# Patient Record
Sex: Female | Born: 1980 | Race: Black or African American | Hispanic: No | Smoking: Never smoker
Health system: Southern US, Community
[De-identification: ages and names within clinical notes are randomized; demographics above are authoritative.]

## PROBLEM LIST (undated history)

## (undated) DIAGNOSIS — IMO0002 Reserved for concepts with insufficient information to code with codable children: Secondary | ICD-10-CM

## (undated) DIAGNOSIS — J302 Other seasonal allergic rhinitis: Secondary | ICD-10-CM

## (undated) HISTORY — DX: Other seasonal allergic rhinitis: J30.2

## (undated) HISTORY — DX: Reserved for concepts with insufficient information to code with codable children: IMO0002

---

## 2000-12-12 ENCOUNTER — Other Ambulatory Visit: Admission: RE | Admit: 2000-12-12 | Discharge: 2000-12-12 | Payer: Self-pay | Admitting: Obstetrics and Gynecology

## 2001-01-08 ENCOUNTER — Encounter: Admission: RE | Admit: 2001-01-08 | Discharge: 2001-01-08 | Payer: Self-pay | Admitting: Obstetrics and Gynecology

## 2001-01-08 ENCOUNTER — Encounter: Payer: Self-pay | Admitting: Obstetrics and Gynecology

## 2001-01-23 ENCOUNTER — Emergency Department (HOSPITAL_COMMUNITY): Admission: EM | Admit: 2001-01-23 | Discharge: 2001-01-23 | Payer: Self-pay

## 2001-12-13 ENCOUNTER — Other Ambulatory Visit: Admission: RE | Admit: 2001-12-13 | Discharge: 2001-12-13 | Payer: Self-pay | Admitting: Obstetrics & Gynecology

## 2002-08-28 ENCOUNTER — Other Ambulatory Visit: Admission: RE | Admit: 2002-08-28 | Discharge: 2002-08-28 | Payer: Self-pay | Admitting: Obstetrics and Gynecology

## 2003-02-12 ENCOUNTER — Inpatient Hospital Stay (HOSPITAL_COMMUNITY): Admission: AD | Admit: 2003-02-12 | Discharge: 2003-02-14 | Payer: Self-pay | Admitting: Obstetrics & Gynecology

## 2003-03-27 ENCOUNTER — Other Ambulatory Visit: Admission: RE | Admit: 2003-03-27 | Discharge: 2003-03-27 | Payer: Self-pay | Admitting: Obstetrics and Gynecology

## 2004-04-29 ENCOUNTER — Other Ambulatory Visit: Admission: RE | Admit: 2004-04-29 | Discharge: 2004-04-29 | Payer: Self-pay | Admitting: Gynecology

## 2005-08-02 ENCOUNTER — Other Ambulatory Visit: Admission: RE | Admit: 2005-08-02 | Discharge: 2005-08-02 | Payer: Self-pay | Admitting: Gynecology

## 2006-09-19 ENCOUNTER — Other Ambulatory Visit: Admission: RE | Admit: 2006-09-19 | Discharge: 2006-09-19 | Payer: Self-pay | Admitting: Gynecology

## 2007-10-31 ENCOUNTER — Other Ambulatory Visit: Admission: RE | Admit: 2007-10-31 | Discharge: 2007-10-31 | Payer: Self-pay | Admitting: Gynecology

## 2008-11-18 ENCOUNTER — Ambulatory Visit: Payer: Self-pay | Admitting: Women's Health

## 2008-11-18 ENCOUNTER — Other Ambulatory Visit: Admission: RE | Admit: 2008-11-18 | Discharge: 2008-11-18 | Payer: Self-pay | Admitting: Gynecology

## 2008-11-18 ENCOUNTER — Encounter: Payer: Self-pay | Admitting: Women's Health

## 2009-11-29 ENCOUNTER — Ambulatory Visit: Payer: Self-pay | Admitting: Women's Health

## 2009-11-29 ENCOUNTER — Other Ambulatory Visit: Admission: RE | Admit: 2009-11-29 | Discharge: 2009-11-29 | Payer: Self-pay | Admitting: Gynecology

## 2010-04-06 ENCOUNTER — Ambulatory Visit: Payer: Self-pay | Admitting: Women's Health

## 2010-12-16 ENCOUNTER — Other Ambulatory Visit: Payer: Self-pay | Admitting: Women's Health

## 2010-12-16 ENCOUNTER — Other Ambulatory Visit (HOSPITAL_COMMUNITY)
Admission: RE | Admit: 2010-12-16 | Discharge: 2010-12-16 | Disposition: A | Discharge status: Home / Self Care | Payer: BC Managed Care – PPO | Source: Ambulatory Visit | Attending: Gynecology | Admitting: Gynecology

## 2010-12-16 ENCOUNTER — Encounter (INDEPENDENT_AMBULATORY_CARE_PROVIDER_SITE_OTHER): Payer: BC Managed Care – PPO | Admitting: Women's Health

## 2010-12-16 DIAGNOSIS — Z01419 Encounter for gynecological examination (general) (routine) without abnormal findings: Secondary | ICD-10-CM

## 2010-12-16 DIAGNOSIS — Z124 Encounter for screening for malignant neoplasm of cervix: Secondary | ICD-10-CM | POA: Insufficient documentation

## 2011-02-03 NOTE — H&P (Signed)
NAME:  CHYAN, CARNERO [facility]                   ACCOUNT NO.:  [id number]   MEDICAL RECORD NO.:  [id number]                   PATIENT TYPE:  INP   LOCATION:  9162                                 FACILITY:  [facility]   PHYSICIAN:  Genia Del, M.D.             DATE OF BIRTH:  January 06, 1981   DATE OF ADMISSION:  02/12/2003  DATE OF DISCHARGE:                                HISTORY & PHYSICAL   CHIEF COMPLAINT:  The patient is a 30 year old G2 P0 A1 with expected date  of delivery by ultrasound Feb 10, 2003 at 40 weeks and two days gestation.   REASON FOR ADMISSION:  Regular uterine contractions since 1 a.m. on Feb 12, 2003.   HISTORY OF PRESENT ILLNESS:  The patient had increasing uterine contractions  becoming regular since 1 a.m.  No vaginal bleeding, no fluid leak, good  fetal movement, no PIH symptoms.   PAST MEDICAL HISTORY:  Positive for cystitis.   PAST SURGICAL HISTORY:  Positive for therapeutic abortion.   PAST OBSTETRICAL HISTORY:  August 2000, eight to ten weeks, therapeutic  abortion; no complication.   ALLERGIES:  No known drug allergies.   MEDICATIONS:  Prenate vitamins.   FAMILY HISTORY:  Positive for chronic hypertension and diabetes mellitus and  migraines.   HISTORY OF PRESENT PREGNANCY:  The first trimester was normal.  Labs showed  hemoglobin at 11.9, platelets 265, blood type and Rh B positive, Rh antibody  negative.  Electrophoresis of hemoglobin was within normal limits.  RPR  nonreactive, HbsAg negative, HIV nonreactive, rubella immune.  Pap test was  within normal limits, gonorrhea and chlamydia negative.  In the second  trimester an ultrasound was done for larger than gestational age per uterine  height and dating was adjusted by ultrasound at 17 weeks.  Triple test was  within normal limits.  Ultrasound review of anatomy was within normal  limits, placenta normal.  In the third trimester, one-hour glucose tolerance  test was normal.  Group B strep  was positive at 35 weeks.  Blood pressures  remained normal throughout pregnancy.  Uterine height responded well.   REVIEW OF SYSTEMS:  CONSTITUTIONAL:  Negative.  HEENT:  Negative.  CARDIOVASCULAR, RESPIRATORY:  Negative.  GI, URO:  Negative.  DERMATO,  NEURO, ENDOCRINO:  Negative.   PHYSICAL EXAMINATION:  GENERAL:  No apparent distress except for pain with  contractions.  VITAL SIGNS:  Blood pressure 131/81, pulse 81 to 121, respiratory rate 20,  temperature 98.9.  LUNGS:  Clear bilaterally.  HEART:  Regular cardiac rhythm.  ABDOMEN:  Gravid uterus, cephalic presentation.  PELVIC:  On admission, vaginal exam by nurse 6+ dilated, 100% effaced,  vertex, -1, membranes intact.  EXTREMITIES:  Lower limbs normal, no edema.  MONITORING:  Fetal heart rate 130s to 140s with good variability  accelerations positive, no decelerations.  Uterine contractions every two to  three minutes, 60 seconds, moderate.   IMPRESSION:  G2 P0 A1 at  40 weeks and two days gestation with spontaneous  labor, fetal well-being reassuring, group B strep positive.   PLAN:  1. Admit to labor and delivery.  2. Expectant management with probable vaginal delivery.  3. Penicillin G for group B strep positive per protocol.  4. Monitoring.                                               Genia Del, M.D.    ML/MEDQ  D:  02/12/2003  T:  02/12/2003  Job:  [id number]

## 2011-04-13 ENCOUNTER — Encounter: Payer: Self-pay | Admitting: Women's Health

## 2011-04-13 ENCOUNTER — Ambulatory Visit (INDEPENDENT_AMBULATORY_CARE_PROVIDER_SITE_OTHER): Payer: BC Managed Care – PPO | Admitting: Women's Health

## 2011-04-13 VITALS — BP 120/80

## 2011-04-13 DIAGNOSIS — B373 Candidiasis of vulva and vagina: Secondary | ICD-10-CM

## 2011-04-13 DIAGNOSIS — IMO0001 Reserved for inherently not codable concepts without codable children: Secondary | ICD-10-CM

## 2011-04-13 DIAGNOSIS — R350 Frequency of micturition: Secondary | ICD-10-CM

## 2011-04-13 MED ORDER — FLUCONAZOLE 150 MG PO TABS: 150.0000 mg | ORAL_TABLET | Freq: Once | ORAL | Status: AC

## 2011-04-13 NOTE — Progress Notes (Signed)
  Presents with a complaint of urinary frequency and some low-back pain. No CVAT. Denies a fever. Has had minimal discharge. Contraception on Ortho Evra patch.cycle is due this week UA is negative except for a small amount of blood will check a urine culture. External genitalia is within normal limits. Speculum exam scant brown discharge with no odor was noted. No erythema. Wet prep positive for yeast. We'll treat with Diflucan 150 by mouth x1 dose. Return to the office as needed or no relief.

## 2011-12-25 ENCOUNTER — Encounter: Payer: Self-pay | Admitting: Women's Health

## 2011-12-25 ENCOUNTER — Ambulatory Visit (INDEPENDENT_AMBULATORY_CARE_PROVIDER_SITE_OTHER): Payer: BC Managed Care – PPO | Admitting: Women's Health

## 2011-12-25 ENCOUNTER — Other Ambulatory Visit (HOSPITAL_COMMUNITY)
Admission: RE | Admit: 2011-12-25 | Discharge: 2011-12-25 | Disposition: A | Discharge status: Home / Self Care | Payer: BC Managed Care – PPO | Source: Ambulatory Visit | Attending: Obstetrics and Gynecology | Admitting: Obstetrics and Gynecology

## 2011-12-25 VITALS — BP 114/84 | Ht 65.25 in | Wt 188.0 lb

## 2011-12-25 DIAGNOSIS — IMO0001 Reserved for inherently not codable concepts without codable children: Secondary | ICD-10-CM

## 2011-12-25 DIAGNOSIS — Z309 Encounter for contraceptive management, unspecified: Secondary | ICD-10-CM

## 2011-12-25 DIAGNOSIS — E669 Obesity, unspecified: Secondary | ICD-10-CM | POA: Insufficient documentation

## 2011-12-25 DIAGNOSIS — Z01419 Encounter for gynecological examination (general) (routine) without abnormal findings: Secondary | ICD-10-CM | POA: Insufficient documentation

## 2011-12-25 LAB — CBC WITH DIFFERENTIAL/PLATELET
Basophils Relative: 0 % (ref 0–1)
Eosinophils Relative: 1 % (ref 0–5)
HCT: 41.8 % (ref 36.0–46.0)
Hemoglobin: 13.5 g/dL (ref 12.0–15.0)
MCH: 29.1 pg (ref 26.0–34.0)
MCHC: 32.3 g/dL (ref 30.0–36.0)
MCV: 90.1 fL (ref 78.0–100.0)
Monocytes Absolute: 0.3 10*3/uL (ref 0.1–1.0)
Monocytes Relative: 4 % (ref 3–12)
Neutro Abs: 3.7 10*3/uL (ref 1.7–7.7)

## 2011-12-25 MED ORDER — NORELGESTROMIN-ETH ESTRADIOL 150-35 MCG/24HR TD PTWK: 1.0000 | MEDICATED_PATCH | TRANSDERMAL | Status: DC

## 2011-12-25 NOTE — Patient Instructions (Signed)

## 2011-12-25 NOTE — Progress Notes (Signed)
Madison Combs 05-29-1981 [id number]    History:    The patient presents for annual exam.  Monthly 3-4 day cycle on Ortho Evra patch without complaint. History of normal Paps. Has lost 15 pounds in the past year with diet changes.   Past medical history, past surgical history, family history and social history were all reviewed and documented in the EPIC chart. Care was for her husband with neuroendocrine cancer tumors. High school teacher, plus teaches classes on line. Daughter Madison Combs age 31 doing well.   ROS:  A  ROS was performed and pertinent positives and negatives are included in the history.  Exam:  Filed Vitals:   12/25/11 0835  BP: 114/84    General appearance:  Normal Head/Neck:  Normal, without cervical or supraclavicular adenopathy. Thyroid:  Symmetrical, normal in size, without palpable masses or nodularity. Respiratory  Effort:  Normal  Auscultation:  Clear without wheezing or rhonchi Cardiovascular  Auscultation:  Regular rate, without rubs, murmurs or gallops  Edema/varicosities:  Not grossly evident Abdominal  Soft,nontender, without masses, guarding or rebound.  Liver/spleen:  No organomegaly noted  Hernia:  None appreciated  Skin  Inspection:  Grossly normal  Palpation:  Grossly normal Neurologic/psychiatric  Orientation:  Normal with appropriate conversation.  Mood/affect:  Normal  Genitourinary    Breasts: Examined lying and sitting.     Right: Without masses, retractions, discharge or axillary adenopathy.     Left: Without masses, retractions, discharge or axillary adenopathy.   Inguinal/mons:  Normal without inguinal adenopathy  External genitalia:  Normal  BUS/Urethra/Skene's glands:  Normal  Bladder:  Normal  Vagina:  Normal  Cervix:  Normal  Uterus:  normal in size, shape and contour.  Midline and mobile  Adnexa/parametria:     Rt: Without masses or tenderness.   Lt: Without masses or tenderness.  Anus and perineum: Normal  Digital  rectal exam: Normal sphincter tone without palpated masses or tenderness  Assessment/Plan:  31 y.o. MBF G1 P1 for annual exam without complaint.  Normal GYN exam on Ortho Evra patch Obesity Situational stress /husband with cancer  Plan: Ortho Evra patch prescription, proper use, slight increased risk for blood clots and strokes. Reviewed slightly less effective when greater than 160 pounds. States would prefer to continue on the Ortho Evra patch as opposed to pills. Continue helalthier diet, increase regular exercise for continued weight loss, calcium rich foods and SBE's encouraged. Cares for husband with neuroendocrine cancer tumors, homebound, states coping well at this time. CBC, UA, Pap   [name] [facility], 9:15 AM 12/25/2011

## 2011-12-26 LAB — URINALYSIS W MICROSCOPIC + REFLEX CULTURE
Bilirubin Urine: NEGATIVE
Crystals: NONE SEEN
Glucose, UA: NEGATIVE mg/dL
Ketones, ur: NEGATIVE mg/dL
Protein, ur: NEGATIVE mg/dL

## 2011-12-28 ENCOUNTER — Other Ambulatory Visit: Payer: Self-pay | Admitting: *Deleted

## 2011-12-28 MED ORDER — CIPROFLOXACIN HCL 500 MG PO TABS: 500.0000 mg | ORAL_TABLET | Freq: Two times a day (BID) | ORAL | Status: AC

## 2011-12-30 LAB — URINE CULTURE

## 2012-01-03 ENCOUNTER — Other Ambulatory Visit: Payer: Self-pay | Admitting: Women's Health

## 2012-01-15 ENCOUNTER — Ambulatory Visit: Payer: BC Managed Care – PPO | Admitting: Women's Health

## 2012-01-22 ENCOUNTER — Ambulatory Visit: Payer: BC Managed Care – PPO | Admitting: Women's Health

## 2012-01-31 ENCOUNTER — Encounter: Payer: Self-pay | Admitting: Women's Health

## 2012-01-31 ENCOUNTER — Ambulatory Visit (INDEPENDENT_AMBULATORY_CARE_PROVIDER_SITE_OTHER): Payer: BC Managed Care – PPO | Admitting: Women's Health

## 2012-01-31 ENCOUNTER — Other Ambulatory Visit (HOSPITAL_COMMUNITY)
Admission: RE | Admit: 2012-01-31 | Discharge: 2012-01-31 | Disposition: A | Discharge status: Home / Self Care | Payer: BC Managed Care – PPO | Source: Ambulatory Visit | Attending: Obstetrics and Gynecology | Admitting: Obstetrics and Gynecology

## 2012-01-31 DIAGNOSIS — Z01419 Encounter for gynecological examination (general) (routine) without abnormal findings: Secondary | ICD-10-CM | POA: Insufficient documentation

## 2012-01-31 DIAGNOSIS — R87616 Satisfactory cervical smear but lacking transformation zone: Secondary | ICD-10-CM

## 2012-01-31 NOTE — Progress Notes (Signed)
Patient ID: Madison Combs, female   DOB: [birth year], 31 y.o.   MRN: [id number] Presents for repeat Pap. Pap at annual exam was normal but lacked endocervical cells. Pap taken will triage based on results.

## 2012-05-31 ENCOUNTER — Telehealth: Payer: Self-pay | Admitting: *Deleted

## 2012-05-31 NOTE — Telephone Encounter (Signed)
Telephone call, husband had been ill he had normal endocrine cancer with metastasis. 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 papers signed.

## 2012-05-31 NOTE — Telephone Encounter (Signed)
Pt has a question about FLMA paper she asked if you would sign? Pt said her husband recently passed away. Call back # [phone]. Pt doesn't have a PCP.

## 2012-06-03 ENCOUNTER — Telehealth: Payer: Self-pay | Admitting: Women's Health

## 2012-06-03 NOTE — Telephone Encounter (Signed)
Patient requested forms to be filled out for leave of absence for 6 weeks for work/ husband died 2012/06/05. Papers completed and condolences given. Patient is a high Engineer, site. Daughter [name] 8 doing well. Both have had counseling through hospice.

## 2012-06-26 ENCOUNTER — Telehealth: Payer: Self-pay | Admitting: *Deleted

## 2012-06-26 NOTE — Telephone Encounter (Signed)
Pt said she was told that you would write a return to work letter. Call back # [phone]

## 2012-06-26 NOTE — Telephone Encounter (Signed)
Telephone call, note to return to work faxed as requested.

## 2013-01-02 ENCOUNTER — Encounter: Payer: Self-pay | Admitting: Women's Health

## 2013-01-02 ENCOUNTER — Ambulatory Visit (INDEPENDENT_AMBULATORY_CARE_PROVIDER_SITE_OTHER): Payer: BC Managed Care – PPO | Admitting: Women's Health

## 2013-01-02 VITALS — BP 118/78 | Ht 65.0 in | Wt 187.0 lb

## 2013-01-02 DIAGNOSIS — Z01419 Encounter for gynecological examination (general) (routine) without abnormal findings: Secondary | ICD-10-CM

## 2013-01-02 DIAGNOSIS — IMO0001 Reserved for inherently not codable concepts without codable children: Secondary | ICD-10-CM

## 2013-01-02 DIAGNOSIS — Z309 Encounter for contraceptive management, unspecified: Secondary | ICD-10-CM

## 2013-01-02 DIAGNOSIS — Z13220 Encounter for screening for lipoid disorders: Secondary | ICD-10-CM

## 2013-01-02 DIAGNOSIS — F4321 Adjustment disorder with depressed mood: Secondary | ICD-10-CM | POA: Insufficient documentation

## 2013-01-02 LAB — CBC WITH DIFFERENTIAL/PLATELET
Basophils Absolute: 0 10*3/uL (ref 0.0–0.1)
Basophils Relative: 0 % (ref 0–1)
Eosinophils Relative: 1 % (ref 0–5)
HCT: 39.2 % (ref 36.0–46.0)
MCHC: 33.4 g/dL (ref 30.0–36.0)
MCV: 87.7 fL (ref 78.0–100.0)
Monocytes Absolute: 0.4 10*3/uL (ref 0.1–1.0)
RDW: 13.8 % (ref 11.5–15.5)

## 2013-01-02 LAB — LIPID PANEL: LDL Cholesterol: 127 mg/dL — ABNORMAL HIGH (ref 0–99)

## 2013-01-02 MED ORDER — NORELGESTROMIN-ETH ESTRADIOL 150-35 MCG/24HR TD PTWK: 1.0000 | MEDICATED_PATCH | TRANSDERMAL | Status: DC

## 2013-01-02 NOTE — Patient Instructions (Addendum)

## 2013-01-02 NOTE — Progress Notes (Signed)
Madison Combs [birth year] [id number]    History:    The patient presents for annual exam.  Monthly cycle on Ortho Evra patch. History of normal Paps. Not sexually active, husband died 05/02/12 from neuroendocrine cancer.  Past medical history, past surgical history, family history and social history were all reviewed and documented in the EPIC chart. 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 Mother hypertension.   ROS:  A  ROS was performed and pertinent positives and negatives are included in the history.  Exam:  Filed Vitals:   01/02/13 0944  BP: 118/78    General appearance:  Normal Head/Neck:  Normal, without cervical or supraclavicular adenopathy. Thyroid:  Symmetrical, normal in size, without palpable masses or nodularity. Respiratory  Effort:  Normal  Auscultation:  Clear without wheezing or rhonchi Cardiovascular  Auscultation:  Regular rate, without rubs, murmurs or gallops  Edema/varicosities:  Not grossly evident Abdominal  Soft,nontender, without masses, guarding or rebound.  Liver/spleen:  No organomegaly noted  Hernia:  None appreciated  Skin  Inspection:  Grossly normal  Palpation:  Grossly normal Neurologic/psychiatric  Orientation:  Normal with appropriate conversation.  Mood/affect:  Normal  Genitourinary    Breasts: Examined lying and sitting.     Right: Without masses, retractions, discharge or axillary adenopathy.     Left: Without masses, retractions, discharge or axillary adenopathy.   Inguinal/mons:  Normal without inguinal adenopathy  External genitalia:  Normal  BUS/Urethra/Skene's glands:  Normal  Bladder:  Normal  Vagina:  Normal  Cervix:  Normal  Uterus:   normal in size, shape and contour.  Midline and mobile  Adnexa/parametria:     Rt: Without masses or tenderness.   Lt: Without masses or tenderness.  Anus and perineum: Normal  Digital rectal exam: Normal sphincter tone without palpated masses or  tenderness  Assessment/Plan:  32 y.o. WBF G2 P1  for annual exam.    Normal GYN exam on Ortho Evra patch Occasional left breast tenderness with normal exam  Plan: Continue SBE's, decrease caffeinated beverages, instructed to call if changing breast exam. Continue regular exercise, currently doing [facility], calcium rich diet and vitamin D 1000 daily encouraged. Requests to stay on Ortho Evra patch for cycle control, reviewed slightly less effective due to wt., risk for blood clots and strokes reviewed. Ortho Evra patch prescription given, condoms encouraged if becomes sexually active. Denies need for further counseling has had grief counseling. CBC, lipid panel, UA, Pap normal 2013, new screening guidelines reviewed.  [name] [facility], 10:26 AM 01/02/2013

## 2013-01-03 LAB — URINALYSIS W MICROSCOPIC + REFLEX CULTURE
Bacteria, UA: NONE SEEN
Casts: NONE SEEN
Glucose, UA: NEGATIVE mg/dL
Hgb urine dipstick: NEGATIVE
Ketones, ur: NEGATIVE mg/dL
Protein, ur: NEGATIVE mg/dL
pH: 7.5 (ref 5.0–8.0)

## 2013-06-16 ENCOUNTER — Other Ambulatory Visit: Payer: Self-pay | Admitting: Women's Health

## 2013-11-08 ENCOUNTER — Other Ambulatory Visit: Payer: Self-pay | Admitting: Women's Health

## 2013-11-12 ENCOUNTER — Other Ambulatory Visit: Payer: Self-pay

## 2013-11-12 MED ORDER — NORELGESTROMIN-ETH ESTRADIOL 150-35 MCG/24HR TD PTWK: MEDICATED_PATCH | TRANSDERMAL | Status: DC

## 2014-01-07 ENCOUNTER — Encounter: Payer: Self-pay | Admitting: Women's Health

## 2014-01-07 ENCOUNTER — Ambulatory Visit (INDEPENDENT_AMBULATORY_CARE_PROVIDER_SITE_OTHER): Payer: BC Managed Care – PPO | Admitting: Women's Health

## 2014-01-07 VITALS — BP 124/80 | Ht 65.0 in | Wt 201.8 lb

## 2014-01-07 DIAGNOSIS — Z01419 Encounter for gynecological examination (general) (routine) without abnormal findings: Secondary | ICD-10-CM

## 2014-01-07 DIAGNOSIS — Z309 Encounter for contraceptive management, unspecified: Secondary | ICD-10-CM

## 2014-01-07 DIAGNOSIS — IMO0001 Reserved for inherently not codable concepts without codable children: Secondary | ICD-10-CM

## 2014-01-07 DIAGNOSIS — Z833 Family history of diabetes mellitus: Secondary | ICD-10-CM

## 2014-01-07 DIAGNOSIS — E079 Disorder of thyroid, unspecified: Secondary | ICD-10-CM

## 2014-01-07 LAB — CBC WITH DIFFERENTIAL/PLATELET
BASOS ABS: 0 10*3/uL (ref 0.0–0.1)
BASOS PCT: 0 % (ref 0–1)
EOS ABS: 0.1 10*3/uL (ref 0.0–0.7)
Eosinophils Relative: 1 % (ref 0–5)
HCT: 36.5 % (ref 36.0–46.0)
Hemoglobin: 12.7 g/dL (ref 12.0–15.0)
Lymphocytes Relative: 39 % (ref 12–46)
Lymphs Abs: 2.9 10*3/uL (ref 0.7–4.0)
MCH: 29.7 pg (ref 26.0–34.0)
MCHC: 34.8 g/dL (ref 30.0–36.0)
MCV: 85.3 fL (ref 78.0–100.0)
MONO ABS: 0.5 10*3/uL (ref 0.1–1.0)
Monocytes Relative: 7 % (ref 3–12)
NEUTROS ABS: 3.9 10*3/uL (ref 1.7–7.7)
NEUTROS PCT: 53 % (ref 43–77)
Platelets: 368 10*3/uL (ref 150–400)
RBC: 4.28 MIL/uL (ref 3.87–5.11)
RDW: 13.6 % (ref 11.5–15.5)
WBC: 7.4 10*3/uL (ref 4.0–10.5)

## 2014-01-07 LAB — TSH: TSH: 0.954 u[IU]/mL (ref 0.350–4.500)

## 2014-01-07 LAB — GLUCOSE, RANDOM: GLUCOSE: 82 mg/dL (ref 70–99)

## 2014-01-07 MED ORDER — NORELGESTROMIN-ETH ESTRADIOL 150-35 MCG/24HR TD PTWK: MEDICATED_PATCH | TRANSDERMAL | Status: DC

## 2014-01-07 NOTE — Progress Notes (Signed)
Sheralyn BoatmanDanielle M Bedel 1981-03-12 [id number]    History:    Presents for annual exam. 4 day monthly cycle on Ortho Evra patch. History of normal Paps. Not sexually active, husband died 04/2012 from neuroendocrine cancer. 12 pound weight gain in past year due to eating habits.  Past medical history, past surgical history, family history and social history were all reviewed and documented in the EPIC chart. 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 Mother hypertension.  ROS:  A  ROS was performed and pertinent positives and negatives are included.  Exam:  Filed Vitals:   01/07/14 0810  BP: 124/80    General appearance:  Normal, overweight Thyroid:  Symmetrical, normal in size, without palpable masses or nodularity. Respiratory  Auscultation:  Clear without wheezing or rhonchi Cardiovascular  Auscultation:  Regular rate, without rubs, murmurs or gallops  Edema/varicosities:  Not grossly evident Abdominal  Soft,nontender, without masses, guarding or rebound.  Liver/spleen:  No organomegaly noted  Hernia:  None appreciated  Skin  Inspection:  Grossly normal   Breasts: Examined lying and sitting.     Right: Without masses, retractions, discharge or axillary adenopathy.     Left: Without masses, retractions, discharge or axillary adenopathy. Gentitourinary   Inguinal/mons:  Normal without inguinal adenopathy  External genitalia:  Normal  BUS/Urethra/Skene's glands:  Normal  Vagina:  Normal  Cervix:  Normal  Uterus:  normal in size, shape and contour.  Midline and mobile  Adnexa/parametria:     Rt: Without masses or tenderness.   Lt: Without masses or tenderness.  Anus and perineum: Normal  Digital rectal exam: Normal sphincter tone without palpated masses or tenderness  Assessment/Plan:  33 y.o. WBF G2P1 for annual exam.   Normal GYN exam on Ortho Evra patch Obesity  Plan: Continue SBE's, continue regular exercise, decrease calorie  intake for weight loss, calcium rich diet and vitamin D 1000 daily encouraged. Ortho Evra patch for cycle control, risk for blood clots and strokes reviewed. Ortho Evra patch prescription given, condoms encouraged if becomes sexually active. Pap normal 2013, new guidelines reviewed. UA, TSH, Glu, CBC.   Harrington Challengerancy J Young [facility], 8:47 AM 01/07/2014

## 2014-01-07 NOTE — Patient Instructions (Signed)

## 2014-01-08 LAB — URINALYSIS W MICROSCOPIC + REFLEX CULTURE
BACTERIA UA: NONE SEEN
BILIRUBIN URINE: NEGATIVE
CASTS: NONE SEEN
CRYSTALS: NONE SEEN
GLUCOSE, UA: NEGATIVE mg/dL
Hgb urine dipstick: NEGATIVE
KETONES UR: NEGATIVE mg/dL
Leukocytes, UA: NEGATIVE
Nitrite: NEGATIVE
PH: 8 (ref 5.0–8.0)
Protein, ur: NEGATIVE mg/dL
SPECIFIC GRAVITY, URINE: 1.024 (ref 1.005–1.030)
Urobilinogen, UA: 0.2 mg/dL (ref 0.0–1.0)

## 2014-02-01 ENCOUNTER — Other Ambulatory Visit: Payer: Self-pay | Admitting: Women's Health

## 2014-03-19 ENCOUNTER — Encounter: Payer: Self-pay | Admitting: Women's Health

## 2014-03-19 ENCOUNTER — Ambulatory Visit (INDEPENDENT_AMBULATORY_CARE_PROVIDER_SITE_OTHER): Payer: BC Managed Care – PPO | Admitting: Women's Health

## 2014-03-19 DIAGNOSIS — N899 Noninflammatory disorder of vagina, unspecified: Secondary | ICD-10-CM

## 2014-03-19 DIAGNOSIS — N898 Other specified noninflammatory disorders of vagina: Secondary | ICD-10-CM

## 2014-03-19 DIAGNOSIS — R300 Dysuria: Secondary | ICD-10-CM

## 2014-03-19 LAB — URINALYSIS W MICROSCOPIC + REFLEX CULTURE
Casts: NONE SEEN
Crystals: NONE SEEN
Glucose, UA: NEGATIVE mg/dL
KETONES UR: NEGATIVE mg/dL
Leukocytes, UA: NEGATIVE
NITRITE: NEGATIVE
Specific Gravity, Urine: 1.025 (ref 1.005–1.030)
Urobilinogen, UA: 2 mg/dL — ABNORMAL HIGH (ref 0.0–1.0)
WBC, UA: NONE SEEN WBC/hpf (ref <3)
pH: 6 (ref 5.0–8.0)

## 2014-03-19 LAB — WET PREP FOR TRICH, YEAST, CLUE
Clue Cells Wet Prep HPF POC: NONE SEEN
Trich, Wet Prep: NONE SEEN
WBC WET PREP: NONE SEEN
Yeast Wet Prep HPF POC: NONE SEEN

## 2014-03-19 MED ORDER — NITROFURANTOIN MONOHYD MACRO 100 MG PO CAPS: 100.0000 mg | ORAL_CAPSULE | Freq: Two times a day (BID) | ORAL | Status: AC

## 2014-03-19 NOTE — Progress Notes (Signed)
Patient ID: Madison Combs, female   DOB: Oct 24, 1980, 33 y.o.   MRN: [id number]  Presents with urinary odor, frequency, urgency, and burning x 1 day. Denies fever, chills, back pain, hematuria, itching, or discharge. Not sexually active. Regular monthly cycles on Xulane patch.    Exam: appears well External genitalia: normal Speculum exam: normal. Wet prep: negative Bimanual: No CMT or adnexal tenderness or fullness UA: Mucus, small blood, trace protein, small bilirubin, 2.0 urobilinogen. Culture pending.   Clinical UTI  Plan: Macrobid 100mg  BID x 7 days. Urine culture pending. Instructed to increase fluids and empty bladder frequently. Will call if symptoms worsen or do not improve.

## 2014-03-19 NOTE — Patient Instructions (Signed)

## 2014-03-20 LAB — URINE CULTURE

## 2014-03-24 ENCOUNTER — Ambulatory Visit: Payer: BC Managed Care – PPO | Admitting: Women's Health

## 2014-07-20 ENCOUNTER — Encounter: Payer: Self-pay | Admitting: Women's Health

## 2014-09-01 ENCOUNTER — Telehealth: Payer: Self-pay | Admitting: *Deleted

## 2014-09-01 ENCOUNTER — Ambulatory Visit (INDEPENDENT_AMBULATORY_CARE_PROVIDER_SITE_OTHER): Payer: BC Managed Care – PPO | Admitting: Women's Health

## 2014-09-01 ENCOUNTER — Encounter: Payer: Self-pay | Admitting: Women's Health

## 2014-09-01 VITALS — BP 118/80 | Ht 65.0 in | Wt 195.0 lb

## 2014-09-01 DIAGNOSIS — N644 Mastodynia: Secondary | ICD-10-CM

## 2014-09-01 NOTE — Patient Instructions (Signed)

## 2014-09-01 NOTE — Telephone Encounter (Signed)
-----   Message from Harrington ChallengerNancy J Young, NP sent at 09/01/2014  8:53 AM EST ----- Victorino DikeJennifer, patient needs mammogram for bilateral outer aspect mastodynia for 2 weeks. Ultrasound/diagnostic if needed. Normal exam, no injury, breast pendulous. No family history of breast cancer. (Husband died of cancer 2 years ago) if appointment available this week and best, out-of-town week of December 21 following week any time okay.

## 2014-09-01 NOTE — Telephone Encounter (Signed)
Orders placed at breast center, they will contact pt to schedule. 

## 2014-09-01 NOTE — Progress Notes (Signed)
Patient ID: Madison BoatmanDanielle M Bruski, female   DOB: 05-May-1981, 33 y.o.   MRN: [id number] Presents with breast tenderness for 2 weeks, mostly outer aspects of both breasts for 2 weeks, cycle due next week. Contraceptives/cycle control on Ortho Evra patch. Not sexually active for greater than 2 years/widow. Husband died of cancer. Denies injury, activity or exercise change, increase or change in caffeinated beverages. Denies other problems at this time. No family history of breast cancer.  Exam: Appears well breast exam and sitting and lying position, pendulous, no visible retractions, dimpling, palpable nodules, thickening, ecchymosis or erythema. No nipple discharge. Tenderness  outer aspect of both breasts with exam.  Mastodynia  Plan: Diagnostic mammogram, vitamin E twice daily, avoid caffeinated beverages. Going on first date tonight since death of husband, reviewed anxiety may also play a role in tenderness. Has had grief counseling.

## 2014-09-02 NOTE — Telephone Encounter (Signed)
Appointment 09/15/14 @ 10:30 am

## 2014-09-15 ENCOUNTER — Ambulatory Visit
Admission: RE | Admit: 2014-09-15 | Discharge: 2014-09-15 | Disposition: A | Discharge status: Home / Self Care | Payer: BC Managed Care – PPO | Source: Ambulatory Visit | Attending: Women's Health | Admitting: Women's Health

## 2014-09-15 DIAGNOSIS — N644 Mastodynia: Secondary | ICD-10-CM

## 2015-01-13 ENCOUNTER — Ambulatory Visit (INDEPENDENT_AMBULATORY_CARE_PROVIDER_SITE_OTHER): Payer: BC Managed Care – PPO | Admitting: Women's Health

## 2015-01-13 ENCOUNTER — Other Ambulatory Visit (HOSPITAL_COMMUNITY)
Admission: RE | Admit: 2015-01-13 | Discharge: 2015-01-13 | Disposition: A | Discharge status: Home / Self Care | Payer: BC Managed Care – PPO | Source: Ambulatory Visit | Attending: Women's Health | Admitting: Women's Health

## 2015-01-13 ENCOUNTER — Encounter: Payer: Self-pay | Admitting: Women's Health

## 2015-01-13 VITALS — BP 110/70 | Ht 66.0 in | Wt 179.0 lb

## 2015-01-13 DIAGNOSIS — Z13220 Encounter for screening for lipoid disorders: Secondary | ICD-10-CM

## 2015-01-13 DIAGNOSIS — Z01411 Encounter for gynecological examination (general) (routine) with abnormal findings: Secondary | ICD-10-CM | POA: Insufficient documentation

## 2015-01-13 DIAGNOSIS — Z01419 Encounter for gynecological examination (general) (routine) without abnormal findings: Secondary | ICD-10-CM | POA: Diagnosis not present

## 2015-01-13 DIAGNOSIS — Z1151 Encounter for screening for human papillomavirus (HPV): Secondary | ICD-10-CM | POA: Insufficient documentation

## 2015-01-13 DIAGNOSIS — Z113 Encounter for screening for infections with a predominantly sexual mode of transmission: Secondary | ICD-10-CM

## 2015-01-13 DIAGNOSIS — Z3040 Encounter for surveillance of contraceptives, unspecified: Secondary | ICD-10-CM | POA: Diagnosis not present

## 2015-01-13 LAB — CBC WITH DIFFERENTIAL/PLATELET
BASOS ABS: 0 10*3/uL (ref 0.0–0.1)
Basophils Relative: 0 % (ref 0–1)
EOS PCT: 0 % (ref 0–5)
Eosinophils Absolute: 0 10*3/uL (ref 0.0–0.7)
HCT: 40.1 % (ref 36.0–46.0)
Hemoglobin: 13.3 g/dL (ref 12.0–15.0)
LYMPHS ABS: 2.7 10*3/uL (ref 0.7–4.0)
Lymphocytes Relative: 35 % (ref 12–46)
MCH: 29.8 pg (ref 26.0–34.0)
MCHC: 33.2 g/dL (ref 30.0–36.0)
MCV: 89.9 fL (ref 78.0–100.0)
MPV: 9.7 fL (ref 8.6–12.4)
Monocytes Absolute: 0.4 10*3/uL (ref 0.1–1.0)
Monocytes Relative: 5 % (ref 3–12)
NEUTROS ABS: 4.6 10*3/uL (ref 1.7–7.7)
Neutrophils Relative %: 60 % (ref 43–77)
Platelets: 379 10*3/uL (ref 150–400)
RBC: 4.46 MIL/uL (ref 3.87–5.11)
RDW: 14.4 % (ref 11.5–15.5)
WBC: 7.7 10*3/uL (ref 4.0–10.5)

## 2015-01-13 LAB — LIPID PANEL
CHOL/HDL RATIO: 3.4 ratio
Cholesterol: 249 mg/dL — ABNORMAL HIGH (ref 0–200)
HDL: 74 mg/dL (ref >=46)
LDL Cholesterol: 152 mg/dL — ABNORMAL HIGH (ref 0–99)
Triglycerides: 116 mg/dL (ref <150)
VLDL: 23 mg/dL (ref 0–40)

## 2015-01-13 LAB — GLUCOSE, RANDOM: Glucose, Bld: 78 mg/dL (ref 70–99)

## 2015-01-13 MED ORDER — NORELGESTROMIN-ETH ESTRADIOL 150-35 MCG/24HR TD PTWK: MEDICATED_PATCH | TRANSDERMAL | Status: DC

## 2015-01-13 NOTE — Progress Notes (Signed)
Madison BoatmanDanielle M Combs 1980/12/21 [id number]    History:    Presents for annual exam.  Regular monthly cycle on Ortho Evra patch. Normal Pap history. New partner. Husband died 04/2012 from neuroendocrine cancer. Has lost 22 pounds in the past year with diet and exercise.  Past medical history, past surgical history, family history and social history were all reviewed and documented in the EPIC chart. 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 Parents healthy.  ROS:  A ROS was performed and pertinent positives and negatives are included.  Exam:  Filed Vitals:   01/13/15 0836  BP: 110/70    General appearance:  Normal Thyroid:  Symmetrical, normal in size, without palpable masses or nodularity. Respiratory  Auscultation:  Clear without wheezing or rhonchi Cardiovascular  Auscultation:  Regular rate, without rubs, murmurs or gallops  Edema/varicosities:  Not grossly evident Abdominal  Soft,nontender, without masses, guarding or rebound.  Liver/spleen:  No organomegaly noted  Hernia:  None appreciated  Skin  Inspection:  Grossly normal   Breasts: Examined lying and sitting.     Right: Without masses, retractions, discharge or axillary adenopathy.     Left: Without masses, retractions, discharge or axillary adenopathy. Gentitourinary   Inguinal/mons:  Normal without inguinal adenopathy  External genitalia:  Normal  BUS/Urethra/Skene's glands:  Normal  Vagina:  Normal  Cervix:  Normal  Uterus:  normal in size, shape and contour.  Midline and mobile  Adnexa/parametria:     Rt: Without masses or tenderness.   Lt: Without masses or tenderness.  Anus and perineum: Normal  Digital rectal exam: Normal sphincter tone without palpated masses or tenderness  Assessment/Plan:  34 y.o.WBF G1P1  for annual exam with no complaints.  Monthly cycle on Ortho Evra patch STD screen  Plan: Ortho Evra patch prescription, proper use, slight risk for  blood clots and strokes reviewed. Condoms encouraged until permanent partner. SBE's, continue regular exercise and healthy eating for continued weight loss. CBC, glucose, lipid panel, UA, Pap with HR HPV typing, new screening guidelines reviewed. GC/Chlamydia, HIV, hep B, C, RPRHarrington Combs.  Madison Combs [facility]NP, 9:07 AM 01/13/2015

## 2015-01-13 NOTE — Patient Instructions (Signed)

## 2015-01-13 NOTE — Addendum Note (Signed)
Addended by: Aura CampsWEBB, JENNIFER L on: 01/13/2015 09:22 AM   Modules accepted: Orders

## 2015-01-14 LAB — URINALYSIS W MICROSCOPIC + REFLEX CULTURE
Casts: NONE SEEN
Crystals: NONE SEEN
Glucose, UA: NEGATIVE mg/dL
Nitrite: NEGATIVE
Protein, ur: NEGATIVE mg/dL
SPECIFIC GRAVITY, URINE: 1.027 (ref 1.005–1.030)
Urobilinogen, UA: 1 mg/dL (ref 0.0–1.0)
pH: 6 (ref 5.0–8.0)

## 2015-01-14 LAB — HEPATITIS C ANTIBODY: HCV AB: NEGATIVE

## 2015-01-14 LAB — GC/CHLAMYDIA PROBE AMP
CT Probe RNA: NEGATIVE
GC PROBE AMP APTIMA: NEGATIVE

## 2015-01-14 LAB — RPR

## 2015-01-14 LAB — HEPATITIS B SURFACE ANTIGEN: Hepatitis B Surface Ag: NEGATIVE

## 2015-01-14 LAB — CYTOLOGY - PAP

## 2015-01-14 LAB — HIV ANTIBODY (ROUTINE TESTING W REFLEX): HIV: NONREACTIVE

## 2015-01-16 LAB — URINE CULTURE

## 2015-01-26 ENCOUNTER — Other Ambulatory Visit: Payer: Self-pay

## 2015-01-26 DIAGNOSIS — Z3040 Encounter for surveillance of contraceptives, unspecified: Secondary | ICD-10-CM

## 2015-01-26 MED ORDER — NORELGESTROMIN-ETH ESTRADIOL 150-35 MCG/24HR TD PTWK: MEDICATED_PATCH | TRANSDERMAL | Status: DC

## 2015-02-08 ENCOUNTER — Other Ambulatory Visit: Payer: Self-pay | Admitting: Women's Health

## 2015-02-08 DIAGNOSIS — R8271 Bacteriuria: Secondary | ICD-10-CM

## 2015-02-09 ENCOUNTER — Other Ambulatory Visit: Payer: BC Managed Care – PPO

## 2015-02-09 DIAGNOSIS — R8271 Bacteriuria: Secondary | ICD-10-CM

## 2015-02-10 LAB — URINALYSIS W MICROSCOPIC + REFLEX CULTURE
BILIRUBIN URINE: NEGATIVE
Bacteria, UA: NONE SEEN
CRYSTALS: NONE SEEN
Casts: NONE SEEN
GLUCOSE, UA: NEGATIVE mg/dL
Hgb urine dipstick: NEGATIVE
Ketones, ur: NEGATIVE mg/dL
LEUKOCYTES UA: NEGATIVE
NITRITE: NEGATIVE
Protein, ur: NEGATIVE mg/dL
Specific Gravity, Urine: 1.023 (ref 1.005–1.030)
Squamous Epithelial / LPF: NONE SEEN
UROBILINOGEN UA: 1 mg/dL (ref 0.0–1.0)
pH: 5.5 (ref 5.0–8.0)

## 2015-06-15 ENCOUNTER — Other Ambulatory Visit: Payer: Self-pay

## 2015-06-15 DIAGNOSIS — Z3040 Encounter for surveillance of contraceptives, unspecified: Secondary | ICD-10-CM

## 2015-06-15 MED ORDER — NORELGESTROMIN-ETH ESTRADIOL 150-35 MCG/24HR TD PTWK: MEDICATED_PATCH | TRANSDERMAL | Status: DC

## 2015-07-30 ENCOUNTER — Ambulatory Visit (INDEPENDENT_AMBULATORY_CARE_PROVIDER_SITE_OTHER): Payer: BC Managed Care – PPO | Admitting: Women's Health

## 2015-07-30 ENCOUNTER — Encounter: Payer: Self-pay | Admitting: Women's Health

## 2015-07-30 VITALS — BP 120/84 | Ht 66.0 in | Wt 179.0 lb

## 2015-07-30 DIAGNOSIS — R350 Frequency of micturition: Secondary | ICD-10-CM

## 2015-07-30 DIAGNOSIS — N3001 Acute cystitis with hematuria: Secondary | ICD-10-CM | POA: Diagnosis not present

## 2015-07-30 DIAGNOSIS — N898 Other specified noninflammatory disorders of vagina: Secondary | ICD-10-CM | POA: Diagnosis not present

## 2015-07-30 LAB — URINALYSIS W MICROSCOPIC + REFLEX CULTURE
BILIRUBIN URINE: NEGATIVE
CASTS: NONE SEEN [LPF]
Crystals: NONE SEEN [HPF]
Glucose, UA: NEGATIVE
Ketones, ur: NEGATIVE
Leukocytes, UA: NEGATIVE
NITRITE: NEGATIVE
PH: 5 (ref 5.0–8.0)
Protein, ur: NEGATIVE
Specific Gravity, Urine: >1.03 (ref 1.001–1.035)
WBC, UA: NONE SEEN WBC/HPF (ref <=5)
Yeast: NONE SEEN [HPF]

## 2015-07-30 LAB — WET PREP FOR TRICH, YEAST, CLUE
CLUE CELLS WET PREP: NONE SEEN
Trich, Wet Prep: NONE SEEN
YEAST WET PREP: NONE SEEN

## 2015-07-30 MED ORDER — SULFAMETHOXAZOLE-TRIMETHOPRIM 800-160 MG PO TABS: 1.0000 | ORAL_TABLET | Freq: Two times a day (BID) | ORAL | Status: DC

## 2015-07-30 NOTE — Patient Instructions (Signed)

## 2015-07-30 NOTE — Progress Notes (Signed)
Patient ID: [name], female   DOB: 05-14-1981, 34 y.o.   MRN: [id number]  Presents with complaint of 2 day history of increased urinary frequency, urgency and slight pain at end of stream. New symptoms. Same partner/ monthly cycle on Ortho Evra patch. Minimal discharge , mild itching. Denies abdominal pain or fever.  Has lost 35 pounds in the past year with diet and exercise /boot camp.  Exam: Appears well. External genitalia within normal limits, speculum exam scant discharge no erythema or odor noted, wet prep negative. Bimanual no CMT or adnexal tenderness.  UA: +1 blood, no wbc's, 10-20 rbc's, few bacteria   Symptomatic UTI  with hematuria  Plan: Septra twice daily for 3 days #6 prescription, proper use given and reviewed. Urine culture pending. Instructed to call if symptoms persist.

## 2015-08-02 LAB — URINE CULTURE
Colony Count: NO GROWTH
Organism ID, Bacteria: NO GROWTH

## 2015-09-03 ENCOUNTER — Other Ambulatory Visit: Payer: Self-pay

## 2015-09-03 DIAGNOSIS — Z3040 Encounter for surveillance of contraceptives, unspecified: Secondary | ICD-10-CM

## 2015-09-03 MED ORDER — NORELGESTROMIN-ETH ESTRADIOL 150-35 MCG/24HR TD PTWK: MEDICATED_PATCH | TRANSDERMAL | Status: DC

## 2016-01-18 ENCOUNTER — Encounter: Payer: Self-pay | Admitting: Women's Health

## 2016-01-18 ENCOUNTER — Ambulatory Visit (INDEPENDENT_AMBULATORY_CARE_PROVIDER_SITE_OTHER): Payer: BC Managed Care – PPO | Admitting: Women's Health

## 2016-01-18 VITALS — BP 118/80 | Ht 66.0 in | Wt 166.0 lb

## 2016-01-18 DIAGNOSIS — Z833 Family history of diabetes mellitus: Secondary | ICD-10-CM

## 2016-01-18 DIAGNOSIS — Z113 Encounter for screening for infections with a predominantly sexual mode of transmission: Secondary | ICD-10-CM

## 2016-01-18 DIAGNOSIS — Z13220 Encounter for screening for lipoid disorders: Secondary | ICD-10-CM

## 2016-01-18 DIAGNOSIS — Z01419 Encounter for gynecological examination (general) (routine) without abnormal findings: Secondary | ICD-10-CM | POA: Diagnosis not present

## 2016-01-18 DIAGNOSIS — Z3040 Encounter for surveillance of contraceptives, unspecified: Secondary | ICD-10-CM

## 2016-01-18 LAB — CBC WITH DIFFERENTIAL/PLATELET
BASOS ABS: 0 {cells}/uL (ref 0–200)
BASOS PCT: 0 %
EOS ABS: 60 {cells}/uL (ref 15–500)
EOS PCT: 1 %
HCT: 40.7 % (ref 35.0–45.0)
Hemoglobin: 13.3 g/dL (ref 11.7–15.5)
LYMPHS PCT: 40 %
Lymphs Abs: 2400 cells/uL (ref 850–3900)
MCH: 30.2 pg (ref 27.0–33.0)
MCHC: 32.7 g/dL (ref 32.0–36.0)
MCV: 92.3 fL (ref 80.0–100.0)
MONOS PCT: 5 %
MPV: 9.5 fL (ref 7.5–12.5)
Monocytes Absolute: 300 cells/uL (ref 200–950)
NEUTROS PCT: 54 %
Neutro Abs: 3240 cells/uL (ref 1500–7800)
PLATELETS: 394 10*3/uL (ref 140–400)
RBC: 4.41 MIL/uL (ref 3.80–5.10)
RDW: 13.7 % (ref 11.0–15.0)
WBC: 6 10*3/uL (ref 3.8–10.8)

## 2016-01-18 LAB — GLUCOSE, RANDOM: Glucose, Bld: 79 mg/dL (ref 65–99)

## 2016-01-18 LAB — LIPID PANEL
CHOLESTEROL: 242 mg/dL — AB (ref 125–200)
HDL: 97 mg/dL (ref >=46)
LDL Cholesterol: 130 mg/dL — ABNORMAL HIGH (ref <130)
TRIGLYCERIDES: 75 mg/dL (ref <150)
Total CHOL/HDL Ratio: 2.5 Ratio (ref <=5.0)
VLDL: 15 mg/dL (ref <30)

## 2016-01-18 MED ORDER — NORELGESTROMIN-ETH ESTRADIOL 150-35 MCG/24HR TD PTWK: MEDICATED_PATCH | TRANSDERMAL | Status: DC

## 2016-01-18 NOTE — Progress Notes (Signed)
Madison Combs August 24, 1981 [id number]    History:    Presents for annual exam.  Monthly cycle on Ortho Evra, reports occasional spotting week between week 2 and 3. Normal Pap history. Sexually active with new partner/condoms. Has lost 15 pounds with diet and exercise in the past year.  Past medical history, past surgical history, family history and social history were all reviewed and documented in the EPIC chart. High school teacher. Husband died in 2013 from neuroendocrine cancer. Madison Combs is [age] doing well, has had gardasil.  ROS:  A ROS was performed and pertinent positives and negatives are included.  Exam:  Filed Vitals:   01/18/16 0804  BP: 118/80    General appearance:  Normal Thyroid:  Symmetrical, normal in size, without palpable masses or nodularity. Respiratory  Auscultation:  Clear without wheezing or rhonchi Cardiovascular  Auscultation:  Regular rate, without rubs, murmurs or gallops  Edema/varicosities:  Not grossly evident Abdominal  Soft,nontender, without masses, guarding or rebound.  Liver/spleen:  No organomegaly noted  Hernia:  None appreciated  Skin  Inspection:  Grossly normal   Breasts: Examined lying and sitting.     Right: Without masses, retractions, discharge or axillary adenopathy.     Left: Without masses, retractions, discharge or axillary adenopathy. Gentitourinary   Inguinal/mons:  Normal without inguinal adenopathy  External genitalia:  Normal  BUS/Urethra/Skene's glands:  Normal  Vagina:  Normal  Cervix:  Normal  Uterus:   normal in size, shape and contour.  Midline and mobile  Adnexa/parametria:     Rt: Without masses or tenderness.   Lt: Without masses or tenderness.  Anus and perineum: Normal  Digital rectal exam: Normal sphincter tone without palpated masses or tenderness  Assessment/Plan:  35 y.o. WBF G1 P1 for annual exam with occasional urinary frequency.  Monthly cycle on Ortho Evra STD screen  Plan: Contraception  options reviewed would like to continue Ortho Evra prescription, proper use given and reviewed slight risk for blood clots and strokes. Condoms encouraged if sexually active. Instructed to keep menstrual record if spotting persists instructed to call. SBE's, continue healthy exercise routine, calcium rich foods, vitamin D 1000 daily encouraged. CBC, glucose, lipid panel, UA, GC/Chlamydia, HIV, hep B, C, RPR. Pap normal 2016, new screening guidelines reviewed.    Harrington ChallengerYOUNG,[name] WHNP, 8:25 AM 01/18/2016

## 2016-01-18 NOTE — Patient Instructions (Signed)
Health Maintenance, Female Adopting a healthy lifestyle and getting preventive care can go a long way to promote health and wellness. Talk with your health care provider about what schedule of regular examinations is right for you. This is a good chance for you to check in with your provider about disease prevention and staying healthy. In between checkups, there are plenty of things you can do on your own. Experts have done a lot of research about which lifestyle changes and preventive measures are most likely to keep you healthy. Ask your health care provider for more information. WEIGHT AND DIET  Eat a healthy diet  Be sure to include plenty of vegetables, fruits, low-fat dairy products, and lean protein.  Do not eat a lot of foods high in solid fats, added sugars, or salt.  Get regular exercise. This is one of the most important things you can do for your health.  Most adults should exercise for at least 150 minutes each week. The exercise should increase your heart rate and make you sweat (moderate-intensity exercise).  Most adults should also do strengthening exercises at least twice a week. This is in addition to the moderate-intensity exercise.  Maintain a healthy weight  Body mass index (BMI) is a measurement that can be used to identify possible weight problems. It estimates body fat based on height and weight. Your health care provider can help determine your BMI and help you achieve or maintain a healthy weight.  For females 20 years of age and older:   A BMI below 18.5 is considered underweight.  A BMI of 18.5 to 24.9 is normal.  A BMI of 25 to 29.9 is considered overweight.  A BMI of 30 and above is considered obese.  Watch levels of cholesterol and blood lipids  You should start having your blood tested for lipids and cholesterol at 35 years of age, then have this test every 5 years.  You may need to have your cholesterol levels checked more often if:  Your lipid  or cholesterol levels are high.  You are older than 35 years of age.  You are at high risk for heart disease.  CANCER SCREENING   Lung Cancer  Lung cancer screening is recommended for adults 55-80 years old who are at high risk for lung cancer because of a history of smoking.  A yearly low-dose CT scan of the lungs is recommended for people who:  Currently smoke.  Have quit within the past 15 years.  Have at least a 30-pack-year history of smoking. A pack year is smoking an average of one pack of cigarettes a day for 1 year.  Yearly screening should continue until it has been 15 years since you quit.  Yearly screening should stop if you develop a health problem that would prevent you from having lung cancer treatment.  Breast Cancer  Practice breast self-awareness. This means understanding how your breasts normally appear and feel.  It also means doing regular breast self-exams. Let your health care provider know about any changes, no matter how small.  If you are in your 20s or 30s, you should have a clinical breast exam (CBE) by a health care provider every 1-3 years as part of a regular health exam.  If you are 40 or older, have a CBE every year. Also consider having a breast X-ray (mammogram) every year.  If you have a family history of breast cancer, talk to your health care provider about genetic screening.  If you   are at high risk for breast cancer, talk to your health care provider about having an MRI and a mammogram every year.  Breast cancer gene (BRCA) assessment is recommended for women who have family members with BRCA-related cancers. BRCA-related cancers include:  Breast.  Ovarian.  Tubal.  Peritoneal cancers.  Results of the assessment will determine the need for genetic counseling and BRCA1 and BRCA2 testing. Cervical Cancer Your health care provider may recommend that you be screened regularly for cancer of the pelvic organs (ovaries, uterus, and  vagina). This screening involves a pelvic examination, including checking for microscopic changes to the surface of your cervix (Pap test). You may be encouraged to have this screening done every 3 years, beginning at age 21.  For women ages 30-65, health care providers may recommend pelvic exams and Pap testing every 3 years, or they may recommend the Pap and pelvic exam, combined with testing for human papilloma virus (HPV), every 5 years. Some types of HPV increase your risk of cervical cancer. Testing for HPV may also be done on women of any age with unclear Pap test results.  Other health care providers may not recommend any screening for nonpregnant women who are considered low risk for pelvic cancer and who do not have symptoms. Ask your health care provider if a screening pelvic exam is right for you.  If you have had past treatment for cervical cancer or a condition that could lead to cancer, you need Pap tests and screening for cancer for at least 20 years after your treatment. If Pap tests have been discontinued, your risk factors (such as having a new sexual partner) need to be reassessed to determine if screening should resume. Some women have medical problems that increase the chance of getting cervical cancer. In these cases, your health care provider may recommend more frequent screening and Pap tests. Colorectal Cancer  This type of cancer can be detected and often prevented.  Routine colorectal cancer screening usually begins at 35 years of age and continues through 35 years of age.  Your health care provider may recommend screening at an earlier age if you have risk factors for colon cancer.  Your health care provider may also recommend using home test kits to check for hidden blood in the stool.  A small camera at the end of a tube can be used to examine your colon directly (sigmoidoscopy or colonoscopy). This is done to check for the earliest forms of colorectal  cancer.  Routine screening usually begins at age 50.  Direct examination of the colon should be repeated every 5-10 years through 35 years of age. However, you may need to be screened more often if early forms of precancerous polyps or small growths are found. Skin Cancer  Check your skin from head to toe regularly.  Tell your health care provider about any new moles or changes in moles, especially if there is a change in a mole's shape or color.  Also tell your health care provider if you have a mole that is larger than the size of a pencil eraser.  Always use sunscreen. Apply sunscreen liberally and repeatedly throughout the day.  Protect yourself by wearing long sleeves, pants, a wide-brimmed hat, and sunglasses whenever you are outside. HEART DISEASE, DIABETES, AND HIGH BLOOD PRESSURE   High blood pressure causes heart disease and increases the risk of stroke. High blood pressure is more likely to develop in:  People who have blood pressure in the high end   of the normal range (130-139/85-89 mm Hg).  People who are overweight or obese.  People who are African American.  If you are 38-23 years of age, have your blood pressure checked every 3-5 years. If you are 61 years of age or older, have your blood pressure checked every year. You should have your blood pressure measured twice--once when you are at a hospital or clinic, and once when you are not at a hospital or clinic. Record the average of the two measurements. To check your blood pressure when you are not at a hospital or clinic, you can use:  An automated blood pressure machine at a pharmacy.  A home blood pressure monitor.  If you are between 45 years and 39 years old, ask your health care provider if you should take aspirin to prevent strokes.  Have regular diabetes screenings. This involves taking a blood sample to check your fasting blood sugar level.  If you are at a normal weight and have a low risk for diabetes,  have this test once every three years after 35 years of age.  If you are overweight and have a high risk for diabetes, consider being tested at a younger age or more often. PREVENTING INFECTION  Hepatitis B  If you have a higher risk for hepatitis B, you should be screened for this virus. You are considered at high risk for hepatitis B if:  You were born in a country where hepatitis B is common. Ask your health care provider which countries are considered high risk.  Your parents were born in a high-risk country, and you have not been immunized against hepatitis B (hepatitis B vaccine).  You have HIV or AIDS.  You use needles to inject street drugs.  You live with someone who has hepatitis B.  You have had sex with someone who has hepatitis B.  You get hemodialysis treatment.  You take certain medicines for conditions, including cancer, organ transplantation, and autoimmune conditions. Hepatitis C  Blood testing is recommended for:  Everyone born from [birth year] through 1965.  Anyone with known risk factors for hepatitis C. Sexually transmitted infections (STIs)  You should be screened for sexually transmitted infections (STIs) including gonorrhea and chlamydia if:  You are sexually active and are younger than 35 years of age.  You are older than 35 years of age and your health care provider tells you that you are at risk for this type of infection.  Your sexual activity has changed since you were last screened and you are at an increased risk for chlamydia or gonorrhea. Ask your health care provider if you are at risk.  If you do not have HIV, but are at risk, it may be recommended that you take a prescription medicine daily to prevent HIV infection. This is called pre-exposure prophylaxis (PrEP). You are considered at risk if:  You are sexually active and do not regularly use condoms or know the HIV status of your partner(s).  You take drugs by injection.  You are sexually  active with a partner who has HIV. Talk with your health care provider about whether you are at high risk of being infected with HIV. If you choose to begin PrEP, you should first be tested for HIV. You should then be tested every 3 months for as long as you are taking PrEP.  PREGNANCY   If you are premenopausal and you may become pregnant, ask your health care provider about preconception counseling.  If you may  become pregnant, take 400 to 800 micrograms (mcg) of folic acid every day.  If you want to prevent pregnancy, talk to your health care provider about birth control (contraception). OSTEOPOROSIS AND MENOPAUSE   Osteoporosis is a disease in which the bones lose minerals and strength with aging. This can result in serious bone fractures. Your risk for osteoporosis can be identified using a bone density scan.  If you are 61 years of age or older, or if you are at risk for osteoporosis and fractures, ask your health care provider if you should be screened.  Ask your health care provider whether you should take a calcium or vitamin D supplement to lower your risk for osteoporosis.  Menopause may have certain physical symptoms and risks.  Hormone replacement therapy may reduce some of these symptoms and risks. Talk to your health care provider about whether hormone replacement therapy is right for you.  HOME CARE INSTRUCTIONS   Schedule regular health, dental, and eye exams.  Stay current with your immunizations.   Do not use any tobacco products including cigarettes, chewing tobacco, or electronic cigarettes.  If you are pregnant, do not drink alcohol.  If you are breastfeeding, limit how much and how often you drink alcohol.  Limit alcohol intake to no more than 1 drink per day for nonpregnant women. One drink equals 12 ounces of beer, 5 ounces of wine, or 1 ounces of hard liquor.  Do not use street drugs.  Do not share needles.  Ask your health care provider for help if  you need support or information about quitting drugs.  Tell your health care provider if you often feel depressed.  Tell your health care provider if you have ever been abused or do not feel safe at home.   This information is not intended to replace advice given to you by your health care provider. Make sure you discuss any questions you have with your health care provider.   Document Released: 03/20/2011 Document Revised: 09/25/2014 Document Reviewed: 08/06/2013 Elsevier Interactive Patient Education [employer].

## 2016-01-19 LAB — URINALYSIS W MICROSCOPIC + REFLEX CULTURE
BILIRUBIN URINE: NEGATIVE
Bacteria, UA: NONE SEEN [HPF]
CASTS: NONE SEEN [LPF]
CRYSTALS: NONE SEEN [HPF]
Glucose, UA: NEGATIVE
HGB URINE DIPSTICK: NEGATIVE
KETONES UR: NEGATIVE
Leukocytes, UA: NEGATIVE
Nitrite: NEGATIVE
Protein, ur: NEGATIVE
Specific Gravity, Urine: 1.031 (ref 1.001–1.035)
Yeast: NONE SEEN [HPF]
pH: 5.5 (ref 5.0–8.0)

## 2016-01-19 LAB — HIV ANTIBODY (ROUTINE TESTING W REFLEX): HIV: NONREACTIVE

## 2016-01-19 LAB — GC/CHLAMYDIA PROBE AMP
CT Probe RNA: NOT DETECTED
GC Probe RNA: NOT DETECTED

## 2016-01-19 LAB — HEPATITIS B SURFACE ANTIGEN: Hepatitis B Surface Ag: NEGATIVE

## 2016-01-19 LAB — HEPATITIS C ANTIBODY: HCV Ab: NEGATIVE

## 2016-01-19 LAB — RPR

## 2016-01-21 LAB — URINE CULTURE

## 2016-01-25 ENCOUNTER — Other Ambulatory Visit: Payer: Self-pay | Admitting: Gynecology

## 2016-01-25 MED ORDER — NITROFURANTOIN MONOHYD MACRO 100 MG PO CAPS: 100.0000 mg | ORAL_CAPSULE | Freq: Two times a day (BID) | ORAL | Status: DC

## 2016-06-06 ENCOUNTER — Other Ambulatory Visit: Payer: Self-pay | Admitting: Women's Health

## 2016-06-06 DIAGNOSIS — Z3040 Encounter for surveillance of contraceptives, unspecified: Secondary | ICD-10-CM

## 2016-10-25 ENCOUNTER — Other Ambulatory Visit: Payer: Self-pay | Admitting: Women's Health

## 2016-10-25 DIAGNOSIS — Z3040 Encounter for surveillance of contraceptives, unspecified: Secondary | ICD-10-CM

## 2016-11-28 ENCOUNTER — Other Ambulatory Visit: Payer: Self-pay | Admitting: Women's Health

## 2016-11-28 DIAGNOSIS — Z3040 Encounter for surveillance of contraceptives, unspecified: Secondary | ICD-10-CM

## 2017-01-18 ENCOUNTER — Encounter: Payer: BC Managed Care – PPO | Admitting: Women's Health

## 2017-01-22 ENCOUNTER — Encounter: Payer: Self-pay | Admitting: Women's Health

## 2017-01-22 ENCOUNTER — Ambulatory Visit (INDEPENDENT_AMBULATORY_CARE_PROVIDER_SITE_OTHER): Payer: BC Managed Care – PPO | Admitting: Women's Health

## 2017-01-22 VITALS — BP 118/78 | Ht 66.0 in | Wt 179.0 lb

## 2017-01-22 DIAGNOSIS — Z13220 Encounter for screening for lipoid disorders: Secondary | ICD-10-CM

## 2017-01-22 DIAGNOSIS — Z3045 Encounter for surveillance of transdermal patch hormonal contraceptive device: Secondary | ICD-10-CM

## 2017-01-22 DIAGNOSIS — Z01419 Encounter for gynecological examination (general) (routine) without abnormal findings: Secondary | ICD-10-CM

## 2017-01-22 LAB — COMPREHENSIVE METABOLIC PANEL
ALK PHOS: 66 U/L (ref 33–115)
ALT: 6 U/L (ref 6–29)
AST: 12 U/L (ref 10–30)
Albumin: 3.6 g/dL (ref 3.6–5.1)
BILIRUBIN TOTAL: 0.4 mg/dL (ref 0.2–1.2)
BUN: 12 mg/dL (ref 7–25)
CO2: 25 mmol/L (ref 20–31)
Calcium: 8.9 mg/dL (ref 8.6–10.2)
Chloride: 103 mmol/L (ref 98–110)
Creat: 0.81 mg/dL (ref 0.50–1.10)
GLUCOSE: 91 mg/dL (ref 65–99)
Potassium: 4 mmol/L (ref 3.5–5.3)
Sodium: 137 mmol/L (ref 135–146)
Total Protein: 6.4 g/dL (ref 6.1–8.1)

## 2017-01-22 LAB — CBC WITH DIFFERENTIAL/PLATELET
BASOS PCT: 0 %
Basophils Absolute: 0 cells/uL (ref 0–200)
Eosinophils Absolute: 0 cells/uL — ABNORMAL LOW (ref 15–500)
Eosinophils Relative: 0 %
HEMATOCRIT: 39.2 % (ref 35.0–45.0)
Hemoglobin: 12.6 g/dL (ref 11.7–15.5)
LYMPHS PCT: 33 %
Lymphs Abs: 2211 cells/uL (ref 850–3900)
MCH: 29.9 pg (ref 27.0–33.0)
MCHC: 32.1 g/dL (ref 32.0–36.0)
MCV: 93.1 fL (ref 80.0–100.0)
MPV: 9.2 fL (ref 7.5–12.5)
Monocytes Absolute: 335 cells/uL (ref 200–950)
Monocytes Relative: 5 %
Neutro Abs: 4154 cells/uL (ref 1500–7800)
Neutrophils Relative %: 62 %
PLATELETS: 358 10*3/uL (ref 140–400)
RBC: 4.21 MIL/uL (ref 3.80–5.10)
RDW: 13.7 % (ref 11.0–15.0)
WBC: 6.7 10*3/uL (ref 3.8–10.8)

## 2017-01-22 LAB — LIPID PANEL
Cholesterol: 209 mg/dL — ABNORMAL HIGH (ref <200)
HDL: 89 mg/dL (ref >50)
LDL CALC: 105 mg/dL — AB (ref <100)
Total CHOL/HDL Ratio: 2.3 Ratio (ref <5.0)
Triglycerides: 74 mg/dL (ref <150)
VLDL: 15 mg/dL (ref <30)

## 2017-01-22 MED ORDER — NORELGESTROMIN-ETH ESTRADIOL 150-35 MCG/24HR TD PTWK: MEDICATED_PATCH | TRANSDERMAL | 4 refills | Status: DC

## 2017-01-22 NOTE — Progress Notes (Signed)
Madison BoatmanDanielle M Combs 06/04/1981 [id number]    History:    Presents for annual exam.  Monthly cycle on Ortho Evra. Not sexually active. Normal Pap history.  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 Husband died 2013 from neurological cancer. Mother healthy. Father stroke.  ROS:  A ROS was performed and pertinent positives and negatives are included.  Exam:  Vitals:   01/22/17 0813  BP: 118/78  Weight: 179 lb (81.2 kg)  Height: 5\' 6"  (1.676 m)   Body mass index is 28.89 kg/m.   General appearance:  Normal Thyroid:  Symmetrical, normal in size, without palpable masses or nodularity. Respiratory  Auscultation:  Clear without wheezing or rhonchi Cardiovascular  Auscultation:  Regular rate, without rubs, murmurs or gallops  Edema/varicosities:  Not grossly evident Abdominal  Soft,nontender, without masses, guarding or rebound.  Liver/spleen:  No organomegaly noted  Hernia:  None appreciated  Skin  Inspection:  Grossly normal   Breasts: Examined lying and sitting.     Right: Without masses, retractions, discharge or axillary adenopathy.     Left: Without masses, retractions, discharge or axillary adenopathy. Gentitourinary   Inguinal/mons:  Normal without inguinal adenopathy  External genitalia:  Normal  BUS/Urethra/Skene's glands:  Normal  Vagina:  Normal  Cervix:  Normal  Uterus:   normal in size, shape and contour.  Midline and mobile  Adnexa/parametria:     Rt: Without masses or tenderness.   Lt: Without masses or tenderness.  Anus and perineum: Normal  Digital rectal exam: Normal sphincter tone without palpated masses or tenderness  Assessment/Plan:  36 y.o. WBF G2 P1  for annual exam with no complaints.  Monthly cycle on Ortho Evra  Plan: Ortho Evra patch prescription, proper use given and reviewed slight risk for blood clots and strokes.  SBE's, exercise, calcium rich diet, vitamin D 1000 daily encouraged. Encouraged to decrease calories and increase exercise for weight loss. Condoms encouraged if sexually active. CBC, CMP, lipid panel, Pap normal with negative HR HPV 2016, new screening guidelines reviewed.    Madison Combs [facility] AnnistonWHNP, 8:49 AM 01/22/2017

## 2017-01-22 NOTE — Patient Instructions (Signed)
Health Maintenance, Female Adopting a healthy lifestyle and getting preventive care can go a long way to promote health and wellness. Talk with your health care provider about what schedule of regular examinations is right for you. This is a good chance for you to check in with your provider about disease prevention and staying healthy. In between checkups, there are plenty of things you can do on your own. Experts have done a lot of research about which lifestyle changes and preventive measures are most likely to keep you healthy. Ask your health care provider for more information. Weight and diet Eat a healthy diet  Be sure to include plenty of vegetables, fruits, low-fat dairy products, and lean protein.  Do not eat a lot of foods high in solid fats, added sugars, or salt.  Get regular exercise. This is one of the most important things you can do for your health.  Most adults should exercise for at least 150 minutes each week. The exercise should increase your heart rate and make you sweat (moderate-intensity exercise).  Most adults should also do strengthening exercises at least twice a week. This is in addition to the moderate-intensity exercise. Maintain a healthy weight  Body mass index (BMI) is a measurement that can be used to identify possible weight problems. It estimates body fat based on height and weight. Your health care provider can help determine your BMI and help you achieve or maintain a healthy weight.  For females 76 years of age and older:  A BMI below 18.5 is considered underweight.  A BMI of 18.5 to 24.9 is normal.  A BMI of 25 to 29.9 is considered overweight.  A BMI of 30 and above is considered obese. Watch levels of cholesterol and blood lipids  You should start having your blood tested for lipids and cholesterol at 36 years of age, then have this test every 5 years.  You may need to have your cholesterol levels checked more often if:  Your lipid or  cholesterol levels are high.  You are older than 36 years of age.  You are at high risk for heart disease. Cancer screening Lung Cancer  Lung cancer screening is recommended for adults 64-42 years old who are at high risk for lung cancer because of a history of smoking.  A yearly low-dose CT scan of the lungs is recommended for people who:  Currently smoke.  Have quit within the past 15 years.  Have at least a 30-pack-year history of smoking. A pack year is smoking an average of one pack of cigarettes a day for 1 year.  Yearly screening should continue until it has been 15 years since you quit.  Yearly screening should stop if you develop a health problem that would prevent you from having lung cancer treatment. Breast Cancer  Practice breast self-awareness. This means understanding how your breasts normally appear and feel.  It also means doing regular breast self-exams. Let your health care provider know about any changes, no matter how small.  If you are in your 20s or 30s, you should have a clinical breast exam (CBE) by a health care provider every 1-3 years as part of a regular health exam.  If you are 34 or older, have a CBE every year. Also consider having a breast X-ray (mammogram) every year.  If you have a family history of breast cancer, talk to your health care provider about genetic screening.  If you are at high risk for breast cancer, talk  to your health care provider about having an MRI and a mammogram every year.  Breast cancer gene (BRCA) assessment is recommended for women who have family members with BRCA-related cancers. BRCA-related cancers include:  Breast.  Ovarian.  Tubal.  Peritoneal cancers.  Results of the assessment will determine the need for genetic counseling and BRCA1 and BRCA2 testing. Cervical Cancer  Your health care provider may recommend that you be screened regularly for cancer of the pelvic organs (ovaries, uterus, and vagina).  This screening involves a pelvic examination, including checking for microscopic changes to the surface of your cervix (Pap test). You may be encouraged to have this screening done every 3 years, beginning at age 24.  For women ages 66-65, health care providers may recommend pelvic exams and Pap testing every 3 years, or they may recommend the Pap and pelvic exam, combined with testing for human papilloma virus (HPV), every 5 years. Some types of HPV increase your risk of cervical cancer. Testing for HPV may also be done on women of any age with unclear Pap test results.  Other health care providers may not recommend any screening for nonpregnant women who are considered low risk for pelvic cancer and who do not have symptoms. Ask your health care provider if a screening pelvic exam is right for you.  If you have had past treatment for cervical cancer or a condition that could lead to cancer, you need Pap tests and screening for cancer for at least 20 years after your treatment. If Pap tests have been discontinued, your risk factors (such as having a new sexual partner) need to be reassessed to determine if screening should resume. Some women have medical problems that increase the chance of getting cervical cancer. In these cases, your health care provider may recommend more frequent screening and Pap tests. Colorectal Cancer  This type of cancer can be detected and often prevented.  Routine colorectal cancer screening usually begins at 36 years of age and continues through 36 years of age.  Your health care provider may recommend screening at an earlier age if you have risk factors for colon cancer.  Your health care provider may also recommend using home test kits to check for hidden blood in the stool.  A small camera at the end of a tube can be used to examine your colon directly (sigmoidoscopy or colonoscopy). This is done to check for the earliest forms of colorectal cancer.  Routine  screening usually begins at age 41.  Direct examination of the colon should be repeated every 5-10 years through 35 years of age. However, you may need to be screened more often if early forms of precancerous polyps or small growths are found. Skin Cancer  Check your skin from head to toe regularly.  Tell your health care provider about any new moles or changes in moles, especially if there is a change in a mole's shape or color.  Also tell your health care provider if you have a mole that is larger than the size of a pencil eraser.  Always use sunscreen. Apply sunscreen liberally and repeatedly throughout the day.  Protect yourself by wearing long sleeves, pants, a wide-brimmed hat, and sunglasses whenever you are outside. Heart disease, diabetes, and high blood pressure  High blood pressure causes heart disease and increases the risk of stroke. High blood pressure is more likely to develop in:  People who have blood pressure in the high end of the normal range (130-139/85-89 mm Hg).  People who are overweight or obese.  People who are African American.  If you are 59-24 years of age, have your blood pressure checked every 3-5 years. If you are 34 years of age or older, have your blood pressure checked every year. You should have your blood pressure measured twice-once when you are at a hospital or clinic, and once when you are not at a hospital or clinic. Record the average of the two measurements. To check your blood pressure when you are not at a hospital or clinic, you can use:  An automated blood pressure machine at a pharmacy.  A home blood pressure monitor.  If you are between 29 years and 60 years old, ask your health care provider if you should take aspirin to prevent strokes.  Have regular diabetes screenings. This involves taking a blood sample to check your fasting blood sugar level.  If you are at a normal weight and have a low risk for diabetes, have this test once  every three years after 36 years of age.  If you are overweight and have a high risk for diabetes, consider being tested at a younger age or more often. Preventing infection Hepatitis B  If you have a higher risk for hepatitis B, you should be screened for this virus. You are considered at high risk for hepatitis B if:  You were born in a country where hepatitis B is common. Ask your health care provider which countries are considered high risk.  Your parents were born in a high-risk country, and you have not been immunized against hepatitis B (hepatitis B vaccine).  You have HIV or AIDS.  You use needles to inject street drugs.  You live with someone who has hepatitis B.  You have had sex with someone who has hepatitis B.  You get hemodialysis treatment.  You take certain medicines for conditions, including cancer, organ transplantation, and autoimmune conditions. Hepatitis C  Blood testing is recommended for:  Everyone born from [birth year] through 1965.  Anyone with known risk factors for hepatitis C. Sexually transmitted infections (STIs)  You should be screened for sexually transmitted infections (STIs) including gonorrhea and chlamydia if:  You are sexually active and are younger than 36 years of age.  You are older than 36 years of age and your health care provider tells you that you are at risk for this type of infection.  Your sexual activity has changed since you were last screened and you are at an increased risk for chlamydia or gonorrhea. Ask your health care provider if you are at risk.  If you do not have HIV, but are at risk, it may be recommended that you take a prescription medicine daily to prevent HIV infection. This is called pre-exposure prophylaxis (PrEP). You are considered at risk if:  You are sexually active and do not regularly use condoms or know the HIV status of your partner(s).  You take drugs by injection.  You are sexually active with a partner  who has HIV. Talk with your health care provider about whether you are at high risk of being infected with HIV. If you choose to begin PrEP, you should first be tested for HIV. You should then be tested every 3 months for as long as you are taking PrEP. Pregnancy  If you are premenopausal and you may become pregnant, ask your health care provider about preconception counseling.  If you may become pregnant, take 400 to 800 micrograms (mcg) of folic acid  every day.  If you want to prevent pregnancy, talk to your health care provider about birth control (contraception). Osteoporosis and menopause  Osteoporosis is a disease in which the bones lose minerals and strength with aging. This can result in serious bone fractures. Your risk for osteoporosis can be identified using a bone density scan.  If you are 4 years of age or older, or if you are at risk for osteoporosis and fractures, ask your health care provider if you should be screened.  Ask your health care provider whether you should take a calcium or vitamin D supplement to lower your risk for osteoporosis.  Menopause may have certain physical symptoms and risks.  Hormone replacement therapy may reduce some of these symptoms and risks. Talk to your health care provider about whether hormone replacement therapy is right for you. Follow these instructions at home:  Schedule regular health, dental, and eye exams.  Stay current with your immunizations.  Do not use any tobacco products including cigarettes, chewing tobacco, or electronic cigarettes.  If you are pregnant, do not drink alcohol.  If you are breastfeeding, limit how much and how often you drink alcohol.  Limit alcohol intake to no more than 1 drink per day for nonpregnant women. One drink equals 12 ounces of beer, 5 ounces of wine, or 1 ounces of hard liquor.  Do not use street drugs.  Do not share needles.  Ask your health care provider for help if you need support  or information about quitting drugs.  Tell your health care provider if you often feel depressed.  Tell your health care provider if you have ever been abused or do not feel safe at home. This information is not intended to replace advice given to you by your health care provider. Make sure you discuss any questions you have with your health care provider. Document Released: 03/20/2011 Document Revised: 02/10/2016 Document Reviewed: 06/08/2015 Elsevier Interactive Patient Education  2017 [employer].

## 2017-01-31 ENCOUNTER — Encounter: Payer: Self-pay | Admitting: Gynecology

## 2017-02-08 ENCOUNTER — Other Ambulatory Visit: Payer: Self-pay | Admitting: Women's Health

## 2017-02-08 DIAGNOSIS — Z3040 Encounter for surveillance of contraceptives, unspecified: Secondary | ICD-10-CM

## 2017-05-01 ENCOUNTER — Other Ambulatory Visit: Payer: Self-pay | Admitting: Women's Health

## 2017-05-01 DIAGNOSIS — Z3040 Encounter for surveillance of contraceptives, unspecified: Secondary | ICD-10-CM

## 2017-07-30 ENCOUNTER — Encounter: Payer: Self-pay | Admitting: Women's Health

## 2017-07-30 ENCOUNTER — Ambulatory Visit: Payer: BC Managed Care – PPO | Admitting: Women's Health

## 2017-07-30 VITALS — BP 120/82

## 2017-07-30 DIAGNOSIS — R350 Frequency of micturition: Secondary | ICD-10-CM

## 2017-07-30 DIAGNOSIS — Z113 Encounter for screening for infections with a predominantly sexual mode of transmission: Secondary | ICD-10-CM | POA: Diagnosis not present

## 2017-07-30 LAB — WET PREP FOR TRICH, YEAST, CLUE

## 2017-07-30 MED ORDER — FLUCONAZOLE 150 MG PO TABS: 150.0000 mg | ORAL_TABLET | Freq: Once | ORAL | 1 refills | Status: AC

## 2017-07-30 NOTE — Progress Notes (Signed)
36 yo WBF G2P1 presents requesting STD testing and complaint of vaginal burning/pruritus. New sexual partner , "rough"  oral intercourse encounter on 07/20/2017, condoms used, problem of erectile dysfunction. Noticed vaginal burning as well as increased urinary frequency/urgency the following day; denies hematuria, fever, foul-smelling urine, and vaginal discharge/odor. Seen by urgent care and given cephalexin with minimal relief of urinary symptoms. Has noticed vaginal "swelling" predominantly on left side. Used Monistat on 07/24/2017 with no relief of vaginal itching/burning symptoms. Ortho Evra patch for cycle control, LMP: 07/25/2017 lasting 3 days. No known medical problems. First sexual encounter in 5 years,  Exam: Appears well. No CVAT. Abdomen soft and nontender. External genitalia within normal limits, speculum exam revealed multiparous cervix within normal limits, no vaginal discharge or odor. Bimanual exam: no CMT adnexal tenderness. Wet prep: negative UA: Trace leukocytes, trace blood, nitrites 6-10 WBC, 0-2 RBC, moderate bacteria,  Vaginal pruritus/burning STD screening   Plan: Diflucan 150 mg qd x 1dose per request for itching, reviewed may not help.Marland Kitchen. Apply A and D ointment externally, loose clothing, open to air especially at at bedtime. RPR, HIV, Hep C, Hep B, GC/Chlamydia cultures pending. Instructed to call if continued symptoms. Urine culture pending.

## 2017-07-31 LAB — HEPATITIS B SURFACE ANTIGEN: Hepatitis B Surface Ag: NONREACTIVE

## 2017-07-31 LAB — C. TRACHOMATIS/N. GONORRHOEAE RNA
C. trachomatis RNA, TMA: NOT DETECTED
N. gonorrhoeae RNA, TMA: NOT DETECTED

## 2017-07-31 LAB — HEPATITIS C ANTIBODY
Hepatitis C Ab: NONREACTIVE
SIGNAL TO CUT-OFF: 0.01

## 2017-07-31 LAB — SYPHILIS: RPR W/REFLEX TO RPR TITER AND TREPONEMAL ANTIBODIES, TRADITIONAL SCREENING AND DIAGNOSIS ALGORITHM: RPR Ser Ql: NONREACTIVE

## 2017-07-31 LAB — HIV ANTIBODY (ROUTINE TESTING W REFLEX): HIV 1&2 Ab, 4th Generation: NONREACTIVE

## 2017-08-01 LAB — URINALYSIS W MICROSCOPIC + REFLEX CULTURE
BILIRUBIN URINE: NEGATIVE
GLUCOSE, UA: NEGATIVE
Hyaline Cast: NONE SEEN /LPF
Nitrites, Initial: NEGATIVE
PH: 7 (ref 5.0–8.0)
PROTEIN: NEGATIVE
Specific Gravity, Urine: 1.015 (ref 1.001–1.03)

## 2017-08-01 LAB — URINE CULTURE
MICRO NUMBER:: 81277260
SPECIMEN QUALITY:: ADEQUATE

## 2017-08-01 LAB — CULTURE INDICATED

## 2017-11-29 ENCOUNTER — Encounter (HOSPITAL_COMMUNITY): Payer: Self-pay | Admitting: Emergency Medicine

## 2017-11-29 ENCOUNTER — Other Ambulatory Visit: Payer: Self-pay

## 2017-11-29 DIAGNOSIS — Y999 Unspecified external cause status: Secondary | ICD-10-CM | POA: Diagnosis not present

## 2017-11-29 DIAGNOSIS — Y9301 Activity, walking, marching and hiking: Secondary | ICD-10-CM | POA: Diagnosis not present

## 2017-11-29 DIAGNOSIS — S71132A Puncture wound without foreign body, left thigh, initial encounter: Secondary | ICD-10-CM | POA: Diagnosis not present

## 2017-11-29 DIAGNOSIS — Z23 Encounter for immunization: Secondary | ICD-10-CM | POA: Diagnosis not present

## 2017-11-29 DIAGNOSIS — Y929 Unspecified place or not applicable: Secondary | ICD-10-CM | POA: Insufficient documentation

## 2017-11-29 DIAGNOSIS — W540XXA Bitten by dog, initial encounter: Secondary | ICD-10-CM | POA: Diagnosis not present

## 2017-11-29 MED ORDER — OXYCODONE-ACETAMINOPHEN 5-325 MG PO TABS
1.0000 | ORAL_TABLET | ORAL | Status: DC | PRN
  Filled 2017-11-29: qty 1

## 2017-11-29 NOTE — ED Triage Notes (Signed)
Pt states she was bit by a german shepard  Pt states she knows the dog and it is up to date on its shots  Pt has a puncture wound on the back of her left thigh  Dressing applied in triage  Bleeding controlled

## 2017-11-30 ENCOUNTER — Emergency Department (HOSPITAL_COMMUNITY)
Admission: EM | Admit: 2017-11-30 | Discharge: 2017-11-30 | Disposition: A | Discharge status: Home / Self Care | Payer: BC Managed Care – PPO | Attending: Emergency Medicine | Admitting: Emergency Medicine

## 2017-11-30 DIAGNOSIS — W540XXA Bitten by dog, initial encounter: Secondary | ICD-10-CM

## 2017-11-30 DIAGNOSIS — S71152A Open bite, left thigh, initial encounter: Secondary | ICD-10-CM

## 2017-11-30 MED ORDER — IBUPROFEN 600 MG PO TABS: 600.0000 mg | ORAL_TABLET | Freq: Four times a day (QID) | ORAL | 0 refills | Status: DC | PRN

## 2017-11-30 MED ORDER — TETANUS-DIPHTH-ACELL PERTUSSIS 5-2.5-18.5 LF-MCG/0.5 IM SUSP
0.5000 mL | Freq: Once | INTRAMUSCULAR | Status: AC
  Administered 2017-11-30: 0.5 mL via INTRAMUSCULAR
  Filled 2017-11-30: qty 0.5

## 2017-11-30 NOTE — ED Provider Notes (Signed)
[facility]-EMERGENCY DEPT Provider Note   CSN: [id number] Arrival date & time: 11/29/17  1958     History   Chief Complaint Chief Complaint  Patient presents with  . Animal Bite    HPI Madison Combs is a 37 y.o. female.  HPI   37 year old female presenting for evaluation of recent dog bite.  Patient report of approximately 5 hours ago, as she was walking with her neighbor, her neighbor's dog appears a bit excited, and jumped on her and accidentally bit her on her left posterior thigh.  She denies any significant pain but later did noticing bleeding from the site to which concerns her.  Pain is currently mild sharp rated as 3 out of 10.  No associated numbness.  The dog is up-to-date with immunization.  This is not an unprovoked bite.  She denies any other injuries.  Past Medical History:  Diagnosis Date  . Ulcer     Patient Active Problem List   Diagnosis Date Noted  . Grief 01/02/2013    History reviewed. No pertinent surgical history.  OB History    Gravida Para Term Preterm AB Living   2 1     1 1    SAB TAB Ectopic Multiple Live Births                   Home Medications    Prior to Admission medications   Medication Sig Start Date End Date Taking? Authorizing Provider  norelgestromin-ethinyl estradiol Burr Medico(XULANE) 150-35 MCG/24HR transdermal patch One patch weekly for 3 weeks one week off 01/22/17   Harrington ChallengerYoung, [name], NP  PREVIDENT 5000 ENAMEL PROTECT 1.1-5 % PSTE as directed. 10/30/16   [provider]    Family History Family History  Problem Relation Age of Onset  . Hypertension Maternal Grandmother   . Cancer Maternal Grandmother        CERVICAL  . Hypertension Mother   . Stroke Father     Social History Social History   Tobacco Use  . Smoking status: Never Smoker  . Smokeless tobacco: Never Used  Substance Use Topics  . Alcohol use: No    Alcohol/week: 0.0 oz  . Drug use: No     Allergies   Patient has no  known allergies.   Review of Systems Review of Systems  Constitutional: Negative for fever.  Skin: Positive for wound.     Physical Exam Updated Vital Signs BP 118/85 (BP Location: Left Arm)   Pulse 100   Temp 98.7 F (37.1 C) (Oral)   Resp 18   Ht 5\' 5"  (1.651 m)   Wt 83.9 kg (185 lb)   LMP 11/13/2017 (Exact Date)   SpO2 100%   BMI 30.79 kg/m   Physical Exam  Constitutional: She appears well-developed and well-nourished. No distress.  HENT:  Head: Atraumatic.  Eyes: Conjunctivae are normal.  Neck: Neck supple.  Neurological: She is alert.  Skin: No rash noted.  Left thigh: There is a small puncture wound noted to the posterior mid thigh mildly tender to palpation, oozing a small amount of blood, no foreign body noted.  No other concerning feature.  No joint involvement.  Psychiatric: She has a normal mood and affect.  Nursing note and vitals reviewed.    ED Treatments / Results  Labs (all labs ordered are listed, but only abnormal results are displayed) Labs Reviewed - No data to display  EKG  EKG Interpretation None  Radiology No results found.  Procedures Procedures (including critical care time)  Medications Ordered in ED Medications  oxyCODONE-acetaminophen (PERCOCET/ROXICET) 5-325 MG per tablet 1 tablet (1 tablet Oral Refused 11/29/17 2100)     Initial Impression / Assessment and Plan / ED Course  I have reviewed the triage vital signs and the nursing notes.  Pertinent labs & imaging results that were available during my care of the patient were reviewed by me and considered in my medical decision making (see chart for details).     BP 118/85 (BP Location: Left Arm)   Pulse 100   Temp 98.7 F (37.1 C) (Oral)   Resp 18   Ht 5\' 5"  (1.651 m)   Wt 83.9 kg (185 lb)   LMP 11/13/2017 (Exact Date)   SpO2 100%   BMI 30.79 kg/m    Final Clinical Impressions(s) / ED Diagnoses   Final diagnoses:  Dog bite of left thigh, initial  encounter    ED Discharge Orders        Ordered    ibuprofen (ADVIL,MOTRIN) 600 MG tablet  Every 6 hours PRN     11/30/17 0112     1:11 AM Patient was bitten by her neighbor's dog earlier tonight.  She has a puncture wound to the posterior aspects of her left thigh.  It appears to be a minor injury.  Wound care provided.  It is inappropriate to perform laceration repair due to increased risk of infection.  Will update tetanus.  Low suspicion for rabies as the dog is up-to-date with immunization.  Will recommend appropriate wound care and discussed return precaution.  Patient otherwise stable for discharge.   Fayrene Helper, PA-C 11/30/17 0113    Ward, Layla Maw, DO 11/30/17 [id number]

## 2018-01-23 ENCOUNTER — Other Ambulatory Visit: Payer: Self-pay | Admitting: Women's Health

## 2018-01-23 DIAGNOSIS — Z3045 Encounter for surveillance of transdermal patch hormonal contraceptive device: Secondary | ICD-10-CM

## 2018-01-28 ENCOUNTER — Encounter: Payer: Self-pay | Admitting: Women's Health

## 2018-01-28 ENCOUNTER — Ambulatory Visit: Payer: BC Managed Care – PPO | Admitting: Women's Health

## 2018-01-28 VITALS — BP 110/72 | Ht 65.5 in | Wt 186.2 lb

## 2018-01-28 DIAGNOSIS — Z13220 Encounter for screening for lipoid disorders: Secondary | ICD-10-CM

## 2018-01-28 DIAGNOSIS — Z01419 Encounter for gynecological examination (general) (routine) without abnormal findings: Secondary | ICD-10-CM | POA: Diagnosis not present

## 2018-01-28 DIAGNOSIS — Z3045 Encounter for surveillance of transdermal patch hormonal contraceptive device: Secondary | ICD-10-CM

## 2018-01-28 LAB — CBC WITH DIFFERENTIAL/PLATELET
Basophils Absolute: 42 cells/uL (ref 0–200)
Basophils Relative: 0.5 %
EOS ABS: 91 {cells}/uL (ref 15–500)
Eosinophils Relative: 1.1 %
HCT: 38.3 % (ref 35.0–45.0)
HEMOGLOBIN: 13 g/dL (ref 11.7–15.5)
LYMPHS ABS: 2955 {cells}/uL (ref 850–3900)
MCH: 30 pg (ref 27.0–33.0)
MCHC: 33.9 g/dL (ref 32.0–36.0)
MCV: 88.2 fL (ref 80.0–100.0)
MONOS PCT: 5.3 %
MPV: 9.8 fL (ref 7.5–12.5)
NEUTROS ABS: 4773 {cells}/uL (ref 1500–7800)
Neutrophils Relative %: 57.5 %
Platelets: 436 10*3/uL — ABNORMAL HIGH (ref 140–400)
RBC: 4.34 10*6/uL (ref 3.80–5.10)
RDW: 12.6 % (ref 11.0–15.0)
TOTAL LYMPHOCYTE: 35.6 %
WBC mixed population: 440 cells/uL (ref 200–950)
WBC: 8.3 10*3/uL (ref 3.8–10.8)

## 2018-01-28 LAB — GLUCOSE, RANDOM: GLUCOSE: 80 mg/dL (ref 65–99)

## 2018-01-28 LAB — LIPID PANEL
Cholesterol: 255 mg/dL — ABNORMAL HIGH (ref <200)
HDL: 81 mg/dL (ref >50)
LDL Cholesterol (Calc): 149 mg/dL (calc) — ABNORMAL HIGH
NON-HDL CHOLESTEROL (CALC): 174 mg/dL — AB (ref <130)
TRIGLYCERIDES: 129 mg/dL (ref <150)
Total CHOL/HDL Ratio: 3.1 (calc) (ref <5.0)

## 2018-01-28 MED ORDER — NORELGESTROMIN-ETH ESTRADIOL 150-35 MCG/24HR TD PTWK
MEDICATED_PATCH | TRANSDERMAL | 4 refills | Status: DC
Start: 2018-01-28 — End: 2018-01-28

## 2018-01-28 MED ORDER — NORELGESTROMIN-ETH ESTRADIOL 150-35 MCG/24HR TD PTWK: MEDICATED_PATCH | TRANSDERMAL | 4 refills | Status: DC

## 2018-01-28 NOTE — Progress Notes (Signed)
Madison Combs 1980/12/21 [id number]    History:    Presents for annual exam.  Monthly cycle on Ortho Evra patch with occasional mid cycle spotting.  Not sexually active, negative STD screen with past partner.  Normal Pap history.  Past medical history, past surgical history, family history and social history were all reviewed and documented in the EPIC chart.  History [name], broadcasting/film/video.  Madison Combs 15 has had Gardasil having some defiance.  Husband died in 01/23/12 from neurologic cancer.  Mother healthy.  Father stroke survivor.  ROS:  A ROS was performed and pertinent positives and negatives are included.  Exam:  Vitals:   01/28/18 0822  Weight: 186 lb 3.2 oz (84.5 kg)  Height: 5' 5.5" (1.664 m)   Body mass index is 30.51 kg/m.   General appearance:  Normal Thyroid:  Symmetrical, normal in size, without palpable masses or nodularity. Respiratory  Auscultation:  Clear without wheezing or rhonchi Cardiovascular  Auscultation:  Regular rate, without rubs, murmurs or gallops  Edema/varicosities:  Not grossly evident Abdominal  Soft,nontender, without masses, guarding or rebound.  Liver/spleen:  No organomegaly noted  Hernia:  None appreciated  Skin  Inspection:  Grossly normal   Breasts: Examined lying and sitting. Pendulous    Right: Without masses, retractions, discharge or axillary adenopathy.     Left: Without masses, retractions, discharge or axillary adenopathy. Gentitourinary   Inguinal/mons:  Normal without inguinal adenopathy  External genitalia:  Normal  BUS/Urethra/Skene's glands:  Normal  Vagina:  Normal  Cervix:  Normal  Uterus:  normal in size, shape and contour.  Midline and mobile  Adnexa/parametria:     Rt: Without masses or tenderness.   Lt: Without masses or tenderness.  Anus and perineum: Normal  Digital rectal exam: Normal sphincter tone without palpated masses or tenderness  Assessment/Plan:  37 y.o. WBF G1P1 for annual exam with no complaints.  Monthly  cycle on Ortho Evra occasional mid cycle spotting Obesity Situational stress adolescent daughter  Plan: Ortho Evra patch prescription, proper use, slight risk for blood clots and strokes reviewed.  Condoms encouraged if sexually active.  Instructed to call or return if continued midcycle spotting.  SBE's, increase regular exercise and decrease carbs/calories for weight loss encouraged.  Gained 7 pounds in the past year.  Discussed counseling to help deal with daughter.  CBC, CMP, lipid panel, Pap with HR HPV typing, new screening guidelines reviewed.    [name] [facility], 8:24 AM 01/28/2018

## 2018-01-28 NOTE — Patient Instructions (Signed)
Carbohydrate Counting for Diabetes Mellitus, Adult Carbohydrate counting is a method for keeping track of how many carbohydrates you eat. Eating carbohydrates naturally increases the amount of sugar (glucose) in the blood. Counting how many carbohydrates you eat helps keep your blood glucose within normal limits, which helps you manage your diabetes (diabetes mellitus). It is important to know how many carbohydrates you can safely have in each meal. This is different for every person. A diet and nutrition specialist (registered dietitian) can help you make a meal plan and calculate how many carbohydrates you should have at each meal and snack. Carbohydrates are found in the following foods:  Grains, such as breads and cereals.  Dried beans and soy products.  Starchy vegetables, such as potatoes, peas, and corn.  Fruit and fruit juices.  Milk and yogurt.  Sweets and snack foods, such as cake, cookies, candy, chips, and soft drinks.  How do I count carbohydrates? There are two ways to count carbohydrates in food. You can use either of the methods or a combination of both. Reading "Nutrition Facts" on packaged food The "Nutrition Facts" list is included on the labels of almost all packaged foods and beverages in the [place] It includes:  The serving size.  Information about nutrients in each serving, including the grams (g) of carbohydrate per serving.  To use the "Nutrition Facts":  Decide how many servings you will have.  Multiply the number of servings by the number of carbohydrates per serving.  The resulting number is the total amount of carbohydrates that you will be having.  Learning standard serving sizes of other foods When you eat foods containing carbohydrates that are not packaged or do not include "Nutrition Facts" on the label, you need to measure the servings in order to count the amount of carbohydrates:  Measure the foods that you will eat with a food scale or  measuring cup, if needed.  Decide how many standard-size servings you will eat.  Multiply the number of servings by 15. Most carbohydrate-rich foods have about 15 g of carbohydrates per serving. ? For example, if you eat 8 oz (170 g) of strawberries, you will have eaten 2 servings and 30 g of carbohydrates (2 servings x 15 g = 30 g).  For foods that have more than one food mixed, such as soups and casseroles, you must count the carbohydrates in each food that is included.  The following list contains standard serving sizes of common carbohydrate-rich foods. Each of these servings has about 15 g of carbohydrates:   hamburger bun or  English muffin.   oz (15 mL) syrup.   oz (14 g) jelly.  1 slice of bread.  1 six-inch tortilla.  3 oz (85 g) cooked rice or pasta.  4 oz (113 g) cooked dried beans.  4 oz (113 g) starchy vegetable, such as peas, corn, or potatoes.  4 oz (113 g) hot cereal.  4 oz (113 g) mashed potatoes or  of a large baked potato.  4 oz (113 g) canned or frozen fruit.  4 oz (120 mL) fruit juice.  4-6 crackers.  6 chicken nuggets.  6 oz (170 g) unsweetened dry cereal.  6 oz (170 g) plain fat-free yogurt or yogurt sweetened with artificial sweeteners.  8 oz (240 mL) milk.  8 oz (170 g) fresh fruit or one small piece of fruit.  24 oz (680 g) popped popcorn.  Example of carbohydrate counting Sample meal  3 oz (85 g) chicken breast.    6 oz (170 g) brown rice.  4 oz (113 g) corn.  8 oz (240 mL) milk.  8 oz (170 g) strawberries with sugar-free whipped topping. Carbohydrate calculation 1. Identify the foods that contain carbohydrates: ? Rice. ? Corn. ? Milk. ? Strawberries. 2. Calculate how many servings you have of each food: ? 2 servings rice. ? 1 serving corn. ? 1 serving milk. ? 1 serving strawberries. 3. Multiply each number of servings by 15 g: ? 2 servings rice x 15 g = 30 g. ? 1 serving corn x 15 g = 15 g. ? 1 serving milk x 15  g = 15 g. ? 1 serving strawberries x 15 g = 15 g. 4. Add together all of the amounts to find the total grams of carbohydrates eaten: ? 30 g + 15 g + 15 g + 15 g = 75 g of carbohydrates total. This information is not intended to replace advice given to you by your health care provider. Make sure you discuss any questions you have with your health care provider. Document Released: 09/04/2005 Document Revised: 03/24/2016 Document Reviewed: 02/16/2016 Elsevier Interactive Patient Education  2018 [place] Maintenance, Female Adopting a healthy lifestyle and getting preventive care can go a long way to promote health and wellness. Talk with your health care provider about what schedule of regular examinations is right for you. This is a good chance for you to check in with your provider about disease prevention and staying healthy. In between checkups, there are plenty of things you can do on your own. Experts have done a lot of research about which lifestyle changes and preventive measures are most likely to keep you healthy. Ask your health care provider for more information. Weight and diet Eat a healthy diet  Be sure to include plenty of vegetables, fruits, low-fat dairy products, and lean protein.  Do not eat a lot of foods high in solid fats, added sugars, or salt.  Get regular exercise. This is one of the most important things you can do for your health. ? Most adults should exercise for at least 150 minutes each week. The exercise should increase your heart rate and make you sweat (moderate-intensity exercise). ? Most adults should also do strengthening exercises at least twice a week. This is in addition to the moderate-intensity exercise.  Maintain a healthy weight  Body mass index (BMI) is a measurement that can be used to identify possible weight problems. It estimates body fat based on height and weight. Your health care provider can help determine your BMI and help you  achieve or maintain a healthy weight.  For females 76 years of age and older: ? A BMI below 18.5 is considered underweight. ? A BMI of 18.5 to 24.9 is normal. ? A BMI of 25 to 29.9 is considered overweight. ? A BMI of 30 and above is considered obese.  Watch levels of cholesterol and blood lipids  You should start having your blood tested for lipids and cholesterol at 37 years of age, then have this test every 5 years.  You may need to have your cholesterol levels checked more often if: ? Your lipid or cholesterol levels are high. ? You are older than 37 years of age. ? You are at high risk for heart disease.  Cancer screening Lung Cancer  Lung cancer screening is recommended for adults 52-25 years old who are at high risk for lung cancer because of a history of smoking.  A  yearly low-dose CT scan of the lungs is recommended for people who: ? Currently smoke. ? Have quit within the past 15 years. ? Have at least a 30-pack-year history of smoking. A pack year is smoking an average of one pack of cigarettes a day for 1 year.  Yearly screening should continue until it has been 15 years since you quit.  Yearly screening should stop if you develop a health problem that would prevent you from having lung cancer treatment.  Breast Cancer  Practice breast self-awareness. This means understanding how your breasts normally appear and feel.  It also means doing regular breast self-exams. Let your health care provider know about any changes, no matter how small.  If you are in your 20s or 30s, you should have a clinical breast exam (CBE) by a health care provider every 1-3 years as part of a regular health exam.  If you are 4 or older, have a CBE every year. Also consider having a breast X-ray (mammogram) every year.  If you have a family history of breast cancer, talk to your health care provider about genetic screening.  If you are at high risk for breast cancer, talk to your health  care provider about having an MRI and a mammogram every year.  Breast cancer gene (BRCA) assessment is recommended for women who have family members with BRCA-related cancers. BRCA-related cancers include: ? Breast. ? Ovarian. ? Tubal. ? Peritoneal cancers.  Results of the assessment will determine the need for genetic counseling and BRCA1 and BRCA2 testing.  Cervical Cancer Your health care provider may recommend that you be screened regularly for cancer of the pelvic organs (ovaries, uterus, and vagina). This screening involves a pelvic examination, including checking for microscopic changes to the surface of your cervix (Pap test). You may be encouraged to have this screening done every 3 years, beginning at age 12.  For women ages 13-65, health care providers may recommend pelvic exams and Pap testing every 3 years, or they may recommend the Pap and pelvic exam, combined with testing for human papilloma virus (HPV), every 5 years. Some types of HPV increase your risk of cervical cancer. Testing for HPV may also be done on women of any age with unclear Pap test results.  Other health care providers may not recommend any screening for nonpregnant women who are considered low risk for pelvic cancer and who do not have symptoms. Ask your health care provider if a screening pelvic exam is right for you.  If you have had past treatment for cervical cancer or a condition that could lead to cancer, you need Pap tests and screening for cancer for at least 20 years after your treatment. If Pap tests have been discontinued, your risk factors (such as having a new sexual partner) need to be reassessed to determine if screening should resume. Some women have medical problems that increase the chance of getting cervical cancer. In these cases, your health care provider may recommend more frequent screening and Pap tests.  Colorectal Cancer  This type of cancer can be detected and often  prevented.  Routine colorectal cancer screening usually begins at 37 years of age and continues through 37 years of age.  Your health care provider may recommend screening at an earlier age if you have risk factors for colon cancer.  Your health care provider may also recommend using home test kits to check for hidden blood in the stool.  A small camera at the end of  a tube can be used to examine your colon directly (sigmoidoscopy or colonoscopy). This is done to check for the earliest forms of colorectal cancer.  Routine screening usually begins at age 45.  Direct examination of the colon should be repeated every 5-10 years through 37 years of age. However, you may need to be screened more often if early forms of precancerous polyps or small growths are found.  Skin Cancer  Check your skin from head to toe regularly.  Tell your health care provider about any new moles or changes in moles, especially if there is a change in a mole's shape or color.  Also tell your health care provider if you have a mole that is larger than the size of a pencil eraser.  Always use sunscreen. Apply sunscreen liberally and repeatedly throughout the day.  Protect yourself by wearing long sleeves, pants, a wide-brimmed hat, and sunglasses whenever you are outside.  Heart disease, diabetes, and high blood pressure  High blood pressure causes heart disease and increases the risk of stroke. High blood pressure is more likely to develop in: ? People who have blood pressure in the high end of the normal range (130-139/85-89 mm Hg). ? People who are overweight or obese. ? People who are African American.  If you are 65-44 years of age, have your blood pressure checked every 3-5 years. If you are 35 years of age or older, have your blood pressure checked every year. You should have your blood pressure measured twice-once when you are at a hospital or clinic, and once when you are not at a hospital or clinic.  Record the average of the two measurements. To check your blood pressure when you are not at a hospital or clinic, you can use: ? An automated blood pressure machine at a pharmacy. ? A home blood pressure monitor.  If you are between 55 years and 4 years old, ask your health care provider if you should take aspirin to prevent strokes.  Have regular diabetes screenings. This involves taking a blood sample to check your fasting blood sugar level. ? If you are at a normal weight and have a low risk for diabetes, have this test once every three years after 37 years of age. ? If you are overweight and have a high risk for diabetes, consider being tested at a younger age or more often. Preventing infection Hepatitis B  If you have a higher risk for hepatitis B, you should be screened for this virus. You are considered at high risk for hepatitis B if: ? You were born in a country where hepatitis B is common. Ask your health care provider which countries are considered high risk. ? Your parents were born in a high-risk country, and you have not been immunized against hepatitis B (hepatitis B vaccine). ? You have HIV or AIDS. ? You use needles to inject street drugs. ? You live with someone who has hepatitis B. ? You have had sex with someone who has hepatitis B. ? You get hemodialysis treatment. ? You take certain medicines for conditions, including cancer, organ transplantation, and autoimmune conditions.  Hepatitis C  Blood testing is recommended for: ? Everyone born from [birth year] through 1965. ? Anyone with known risk factors for hepatitis C.  Sexually transmitted infections (STIs)  You should be screened for sexually transmitted infections (STIs) including gonorrhea and chlamydia if: ? You are sexually active and are younger than 37 years of age. ? You are older than  37 years of age and your health care provider tells you that you are at risk for this type of infection. ? Your sexual  activity has changed since you were last screened and you are at an increased risk for chlamydia or gonorrhea. Ask your health care provider if you are at risk.  If you do not have HIV, but are at risk, it may be recommended that you take a prescription medicine daily to prevent HIV infection. This is called pre-exposure prophylaxis (PrEP). You are considered at risk if: ? You are sexually active and do not regularly use condoms or know the HIV status of your partner(s). ? You take drugs by injection. ? You are sexually active with a partner who has HIV.  Talk with your health care provider about whether you are at high risk of being infected with HIV. If you choose to begin PrEP, you should first be tested for HIV. You should then be tested every 3 months for as long as you are taking PrEP. Pregnancy  If you are premenopausal and you may become pregnant, ask your health care provider about preconception counseling.  If you may become pregnant, take 400 to 800 micrograms (mcg) of folic acid every day.  If you want to prevent pregnancy, talk to your health care provider about birth control (contraception). Osteoporosis and menopause  Osteoporosis is a disease in which the bones lose minerals and strength with aging. This can result in serious bone fractures. Your risk for osteoporosis can be identified using a bone density scan.  If you are 29 years of age or older, or if you are at risk for osteoporosis and fractures, ask your health care provider if you should be screened.  Ask your health care provider whether you should take a calcium or vitamin D supplement to lower your risk for osteoporosis.  Menopause may have certain physical symptoms and risks.  Hormone replacement therapy may reduce some of these symptoms and risks. Talk to your health care provider about whether hormone replacement therapy is right for you. Follow these instructions at home:  Schedule regular health, dental,  and eye exams.  Stay current with your immunizations.  Do not use any tobacco products including cigarettes, chewing tobacco, or electronic cigarettes.  If you are pregnant, do not drink alcohol.  If you are breastfeeding, limit how much and how often you drink alcohol.  Limit alcohol intake to no more than 1 drink per day for nonpregnant women. One drink equals 12 ounces of beer, 5 ounces of wine, or 1 ounces of hard liquor.  Do not use street drugs.  Do not share needles.  Ask your health care provider for help if you need support or information about quitting drugs.  Tell your health care provider if you often feel depressed.  Tell your health care provider if you have ever been abused or do not feel safe at home. This information is not intended to replace advice given to you by your health care provider. Make sure you discuss any questions you have with your health care provider. Document Released: 03/20/2011 Document Revised: 02/10/2016 Document Reviewed: 06/08/2015 Elsevier Interactive Patient Education  [employer].

## 2018-01-28 NOTE — Addendum Note (Signed)
Addended by: Becky Sax on: 01/28/2018 09:27 AM   Modules accepted: Orders

## 2018-02-04 ENCOUNTER — Other Ambulatory Visit: Payer: Self-pay | Admitting: Women's Health

## 2018-02-04 ENCOUNTER — Other Ambulatory Visit: Payer: Self-pay

## 2018-02-04 DIAGNOSIS — R7989 Other specified abnormal findings of blood chemistry: Secondary | ICD-10-CM

## 2018-02-04 DIAGNOSIS — E7800 Pure hypercholesterolemia, unspecified: Secondary | ICD-10-CM

## 2018-02-04 LAB — PAP, TP IMAGING W/ HPV RNA, RFLX HPV TYPE 16,18/45: HPV DNA High Risk: DETECTED — AB

## 2018-02-04 LAB — HPV TYPE 16 AND 18/45 RNA
HPV TYPE 18/45 RNA: NOT DETECTED
HPV Type 16 RNA: DETECTED — AB

## 2018-04-11 ENCOUNTER — Ambulatory Visit: Payer: BC Managed Care – PPO | Admitting: Obstetrics & Gynecology

## 2018-04-11 DIAGNOSIS — Z0289 Encounter for other administrative examinations: Secondary | ICD-10-CM

## 2019-01-31 ENCOUNTER — Other Ambulatory Visit: Payer: Self-pay

## 2019-02-03 ENCOUNTER — Other Ambulatory Visit: Payer: Self-pay

## 2019-02-03 ENCOUNTER — Encounter: Payer: Self-pay | Admitting: Women's Health

## 2019-02-03 ENCOUNTER — Ambulatory Visit (INDEPENDENT_AMBULATORY_CARE_PROVIDER_SITE_OTHER): Payer: BC Managed Care – PPO | Admitting: Women's Health

## 2019-02-03 VITALS — BP 118/80 | Ht 65.0 in | Wt 193.0 lb

## 2019-02-03 DIAGNOSIS — Z113 Encounter for screening for infections with a predominantly sexual mode of transmission: Secondary | ICD-10-CM | POA: Diagnosis not present

## 2019-02-03 DIAGNOSIS — Z3045 Encounter for surveillance of transdermal patch hormonal contraceptive device: Secondary | ICD-10-CM

## 2019-02-03 DIAGNOSIS — Z01419 Encounter for gynecological examination (general) (routine) without abnormal findings: Secondary | ICD-10-CM

## 2019-02-03 DIAGNOSIS — Z13220 Encounter for screening for lipoid disorders: Secondary | ICD-10-CM | POA: Diagnosis not present

## 2019-02-03 MED ORDER — NORELGESTROMIN-ETH ESTRADIOL 150-35 MCG/24HR TD PTWK
MEDICATED_PATCH | TRANSDERMAL | 4 refills | Status: DC
Start: 2019-02-03 — End: 2019-12-25

## 2019-02-03 NOTE — Progress Notes (Signed)
Madison Combs [birth year] [id number]    History:    Presents for annual exam.  Monthly cycle on Ortho Evra patch with occasional spotting week 3.  Had intercourse with a condom consistently several times, currently not sexually active.  Jan 28, 2018 Pap normal with positive high risk #16, did not keep scheduled colposcopy, normal Pap history prior.  Past medical history, past surgical history, family history and social history were all reviewed and documented in the EPIC chart.  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  Husband died in Jan 29, 2012 from neuroendocrine cancer.  Mother healthy, father hypertension, stroke.  ROS:  A ROS was performed and pertinent positives and negatives are included.  Exam:  Vitals:   02/03/19 0842  BP: 118/80  Weight: 193 lb (87.5 kg)  Height: 5\' 5"  (1.651 m)   Body mass index is 32.12 kg/m.   General appearance:  Normal Thyroid:  Symmetrical, normal in size, without palpable masses or nodularity. Respiratory  Auscultation:  Clear without wheezing or rhonchi Cardiovascular  Auscultation:  Regular rate, without rubs, murmurs or gallops  Edema/varicosities:  Not grossly evident Abdominal  Soft,nontender, without masses, guarding or rebound.  Liver/spleen:  No organomegaly noted  Hernia:  None appreciated  Skin  Inspection:  Grossly normal   Breasts: Examined lying and sitting.     Right: Without masses, retractions, discharge or axillary adenopathy.     Left: Without masses, retractions, discharge or axillary adenopathy. Gentitourinary   Inguinal/mons:  Normal without inguinal adenopathy  External genitalia:  Normal  BUS/Urethra/Skene's glands:  Normal  Vagina:  Normal  Cervix:  Normal  Uterus:  normal in size, shape and contour.  Midline and mobile  Adnexa/parametria:     Rt: Without masses or tenderness.   Lt: Without masses or tenderness.  Anus and perineum: Normal  Digital rectal exam: Normal sphincter tone without palpated masses or  tenderness  Assessment/Plan:  38 y.o. W BF G2, P1 for annual exam with no complaints.  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  Reviewed if high risk HPV still present reviewed importance of follow-up with colposcopy with Dr. Seymour Combs.  Aware of importance of continuing condoms until permanent partner.  GC/chlamydia, CBC, CMP, lipid panel.  Options reviewed, will continue with Ortho Evra patch, reviewed spotting week 3 if continues or increases instructed to call.  Aware of need to lose weight, low calorie/carb diet and increase regular exercise encouraged.  SBEs, annual screening mammogram at 40.    [name] [facility], 8:46 AM 02/03/2019

## 2019-02-03 NOTE — Patient Instructions (Addendum)
Health Maintenance, Female Adopting a healthy lifestyle and getting preventive care can go a long way to promote health and wellness. Talk with your health care provider about what schedule of regular examinations is right for you. This is a good chance for you to check in with your provider about disease prevention and staying healthy. In between checkups, there are plenty of things you can do on your own. Experts have done a lot of research about which lifestyle changes and preventive measures are most likely to keep you healthy. Ask your health care provider for more information. Weight and diet Eat a healthy diet  Be sure to include plenty of vegetables, fruits, low-fat dairy products, and lean protein.  Do not eat a lot of foods high in solid fats, added sugars, or salt.  Get regular exercise. This is one of the most important things you can do for your health. ? Most adults should exercise for at least 150 minutes each week. The exercise should increase your heart rate and make you sweat (moderate-intensity exercise). ? Most adults should also do strengthening exercises at least twice a week. This is in addition to the moderate-intensity exercise. Maintain a healthy weight  Body mass index (BMI) is a measurement that can be used to identify possible weight problems. It estimates body fat based on height and weight. Your health care provider can help determine your BMI and help you achieve or maintain a healthy weight.  For females 20 years of age and older: ? A BMI below 18.5 is considered underweight. ? A BMI of 18.5 to 24.9 is normal. ? A BMI of 25 to 29.9 is considered overweight. ? A BMI of 30 and above is considered obese. Watch levels of cholesterol and blood lipids  You should start having your blood tested for lipids and cholesterol at 38 years of age, then have this test every 5 years.  You may need to have your cholesterol levels checked more often if: ? Your lipid or  cholesterol levels are high. ? You are older than 38 years of age. ? You are at high risk for heart disease. Cancer screening Lung Cancer  Lung cancer screening is recommended for adults 55-80 years old who are at high risk for lung cancer because of a history of smoking.  A yearly low-dose CT scan of the lungs is recommended for people who: ? Currently smoke. ? Have quit within the past 15 years. ? Have at least a 30-pack-year history of smoking. A pack year is smoking an average of one pack of cigarettes a day for 1 year.  Yearly screening should continue until it has been 15 years since you quit.  Yearly screening should stop if you develop a health problem that would prevent you from having lung cancer treatment. Breast Cancer  Practice breast self-awareness. This means understanding how your breasts normally appear and feel.  It also means doing regular breast self-exams. Let your health care provider know about any changes, no matter how small.  If you are in your 20s or 30s, you should have a clinical breast exam (CBE) by a health care provider every 1-3 years as part of a regular health exam.  If you are 40 or older, have a CBE every year. Also consider having a breast X-ray (mammogram) every year.  If you have a family history of breast cancer, talk to your health care provider about genetic screening.  If you are at high risk for breast cancer, talk   to your health care provider about having an MRI and a mammogram every year.  Breast cancer gene (BRCA) assessment is recommended for women who have family members with BRCA-related cancers. BRCA-related cancers include: ? Breast. ? Ovarian. ? Tubal. ? Peritoneal cancers.  Results of the assessment will determine the need for genetic counseling and BRCA1 and BRCA2 testing. Cervical Cancer Your health care provider may recommend that you be screened regularly for cancer of the pelvic organs (ovaries, uterus, and vagina).  This screening involves a pelvic examination, including checking for microscopic changes to the surface of your cervix (Pap test). You may be encouraged to have this screening done every 3 years, beginning at age 21.  For women ages 30-65, health care providers may recommend pelvic exams and Pap testing every 3 years, or they may recommend the Pap and pelvic exam, combined with testing for human papilloma virus (HPV), every 5 years. Some types of HPV increase your risk of cervical cancer. Testing for HPV may also be done on women of any age with unclear Pap test results.  Other health care providers may not recommend any screening for nonpregnant women who are considered low risk for pelvic cancer and who do not have symptoms. Ask your health care provider if a screening pelvic exam is right for you.  If you have had past treatment for cervical cancer or a condition that could lead to cancer, you need Pap tests and screening for cancer for at least 20 years after your treatment. If Pap tests have been discontinued, your risk factors (such as having a new sexual partner) need to be reassessed to determine if screening should resume. Some women have medical problems that increase the chance of getting cervical cancer. In these cases, your health care provider may recommend more frequent screening and Pap tests. Colorectal Cancer  This type of cancer can be detected and often prevented.  Routine colorectal cancer screening usually begins at 38 years of age and continues through 38 years of age.  Your health care provider may recommend screening at an earlier age if you have risk factors for colon cancer.  Your health care provider may also recommend using home test kits to check for hidden blood in the stool.  A small camera at the end of a tube can be used to examine your colon directly (sigmoidoscopy or colonoscopy). This is done to check for the earliest forms of colorectal cancer.  Routine  screening usually begins at age 50.  Direct examination of the colon should be repeated every 5-10 years through 38 years of age. However, you may need to be screened more often if early forms of precancerous polyps or small growths are found. Skin Cancer  Check your skin from head to toe regularly.  Tell your health care provider about any new moles or changes in moles, especially if there is a change in a mole's shape or color.  Also tell your health care provider if you have a mole that is larger than the size of a pencil eraser.  Always use sunscreen. Apply sunscreen liberally and repeatedly throughout the day.  Protect yourself by wearing long sleeves, pants, a wide-brimmed hat, and sunglasses whenever you are outside. Heart disease, diabetes, and high blood pressure  High blood pressure causes heart disease and increases the risk of stroke. High blood pressure is more likely to develop in: ? People who have blood pressure in the high end of the normal range (130-139/85-89 mm Hg). ? People   who are overweight or obese. ? People who are African American.  If you are 84-22 years of age, have your blood pressure checked every 3-5 years. If you are 67 years of age or older, have your blood pressure checked every year. You should have your blood pressure measured twice-once when you are at a hospital or clinic, and once when you are not at a hospital or clinic. Record the average of the two measurements. To check your blood pressure when you are not at a hospital or clinic, you can use: ? An automated blood pressure machine at a pharmacy. ? A home blood pressure monitor.  If you are between 52 years and 3 years old, ask your health care provider if you should take aspirin to prevent strokes.  Have regular diabetes screenings. This involves taking a blood sample to check your fasting blood sugar level. ? If you are at a normal weight and have a low risk for diabetes, have this test once  every three years after 38 years of age. ? If you are overweight and have a high risk for diabetes, consider being tested at a younger age or more often. Preventing infection Hepatitis B  If you have a higher risk for hepatitis B, you should be screened for this virus. You are considered at high risk for hepatitis B if: ? You were born in a country where hepatitis B is common. Ask your health care provider which countries are considered high risk. ? Your parents were born in a high-risk country, and you have not been immunized against hepatitis B (hepatitis B vaccine). ? You have HIV or AIDS. ? You use needles to inject street drugs. ? You live with someone who has hepatitis B. ? You have had sex with someone who has hepatitis B. ? You get hemodialysis treatment. ? You take certain medicines for conditions, including cancer, organ transplantation, and autoimmune conditions. Hepatitis C  Blood testing is recommended for: ? Everyone born from [birth year] through 1965. ? Anyone with known risk factors for hepatitis C. Sexually transmitted infections (STIs)  You should be screened for sexually transmitted infections (STIs) including gonorrhea and chlamydia if: ? You are sexually active and are younger than 38 years of age. ? You are older than 38 years of age and your health care provider tells you that you are at risk for this type of infection. ? Your sexual activity has changed since you were last screened and you are at an increased risk for chlamydia or gonorrhea. Ask your health care provider if you are at risk.  If you do not have HIV, but are at risk, it may be recommended that you take a prescription medicine daily to prevent HIV infection. This is called pre-exposure prophylaxis (PrEP). You are considered at risk if: ? You are sexually active and do not regularly use condoms or know the HIV status of your partner(s). ? You take drugs by injection. ? You are sexually active with a partner  who has HIV. Talk with your health care provider about whether you are at high risk of being infected with HIV. If you choose to begin PrEP, you should first be tested for HIV. You should then be tested every 3 months for as long as you are taking PrEP. Pregnancy  If you are premenopausal and you may become pregnant, ask your health care provider about preconception counseling.  If you may become pregnant, take 400 to 800 micrograms (mcg) of folic acid every  day.  If you want to prevent pregnancy, talk to your health care provider about birth control (contraception). Osteoporosis and menopause  Osteoporosis is a disease in which the bones lose minerals and strength with aging. This can result in serious bone fractures. Your risk for osteoporosis can be identified using a bone density scan.  If you are 65 years of age or older, or if you are at risk for osteoporosis and fractures, ask your health care provider if you should be screened.  Ask your health care provider whether you should take a calcium or vitamin D supplement to lower your risk for osteoporosis.  Menopause may have certain physical symptoms and risks.  Hormone replacement therapy may reduce some of these symptoms and risks. Talk to your health care provider about whether hormone replacement therapy is right for you. Follow these instructions at home:  Schedule regular health, dental, and eye exams.  Stay current with your immunizations.  Do not use any tobacco products including cigarettes, chewing tobacco, or electronic cigarettes.  If you are pregnant, do not drink alcohol.  If you are breastfeeding, limit how much and how often you drink alcohol.  Limit alcohol intake to no more than 1 drink per day for nonpregnant women. One drink equals 12 ounces of beer, 5 ounces of wine, or 1 ounces of hard liquor.  Do not use street drugs.  Do not share needles.  Ask your health care provider for help if you need support  or information about quitting drugs.  Tell your health care provider if you often feel depressed.  Tell your health care provider if you have ever been abused or do not feel safe at home. This information is not intended to replace advice given to you by your health care provider. Make sure you discuss any questions you have with your health care provider. Document Released: 03/20/2011 Document Revised: 02/10/2016 Document Reviewed: 06/08/2015 Elsevier Interactive Patient Education  2019 [employer] Inc.  Carbohydrate Counting for Diabetes Mellitus, Adult  Carbohydrate counting is a method of keeping track of how many carbohydrates you eat. Eating carbohydrates naturally increases the amount of sugar (glucose) in the blood. Counting how many carbohydrates you eat helps keep your blood glucose within normal limits, which helps you manage your diabetes (diabetes mellitus). It is important to know how many carbohydrates you can safely have in each meal. This is different for every person. A diet and nutrition specialist (registered dietitian) can help you make a meal plan and calculate how many carbohydrates you should have at each meal and snack. Carbohydrates are found in the following foods:  Grains, such as breads and cereals.  Dried beans and soy products.  Starchy vegetables, such as potatoes, peas, and corn.  Fruit and fruit juices.  Milk and yogurt.  Sweets and snack foods, such as cake, cookies, candy, chips, and soft drinks. How do I count carbohydrates? There are two ways to count carbohydrates in food. You can use either of the methods or a combination of both. Reading "Nutrition Facts" on packaged food The "Nutrition Facts" list is included on the labels of almost all packaged foods and beverages in the [place] It includes:  The serving size.  Information about nutrients in each serving, including the grams (g) of carbohydrate per serving. To use the "Nutrition  Facts":  Decide how many servings you will have.  Multiply the number of servings by the number of carbohydrates per serving.  The resulting number is the total amount of   carbohydrates that you will be having. Learning standard serving sizes of other foods When you eat carbohydrate foods that are not packaged or do not include "Nutrition Facts" on the label, you need to measure the servings in order to count the amount of carbohydrates:  Measure the foods that you will eat with a food scale or measuring cup, if needed.  Decide how many standard-size servings you will eat.  Multiply the number of servings by 15. Most carbohydrate-rich foods have about 15 g of carbohydrates per serving. ? For example, if you eat 8 oz (170 g) of strawberries, you will have eaten 2 servings and 30 g of carbohydrates (2 servings x 15 g = 30 g).  For foods that have more than one food mixed, such as soups and casseroles, you must count the carbohydrates in each food that is included. The following list contains standard serving sizes of common carbohydrate-rich foods. Each of these servings has about 15 g of carbohydrates:   hamburger bun or  English muffin.   oz (15 mL) syrup.   oz (14 g) jelly.  1 slice of bread.  1 six-inch tortilla.  3 oz (85 g) cooked rice or pasta.  4 oz (113 g) cooked dried beans.  4 oz (113 g) starchy vegetable, such as peas, corn, or potatoes.  4 oz (113 g) hot cereal.  4 oz (113 g) mashed potatoes or  of a large baked potato.  4 oz (113 g) canned or frozen fruit.  4 oz (120 mL) fruit juice.  4-6 crackers.  6 chicken nuggets.  6 oz (170 g) unsweetened dry cereal.  6 oz (170 g) plain fat-free yogurt or yogurt sweetened with artificial sweeteners.  8 oz (240 mL) milk.  8 oz (170 g) fresh fruit or one small piece of fruit.  24 oz (680 g) popped popcorn. Example of carbohydrate counting Sample meal  3 oz (85 g) chicken breast.  6 oz (170 g) brown  rice.  4 oz (113 g) corn.  8 oz (240 mL) milk.  8 oz (170 g) strawberries with sugar-free whipped topping. Carbohydrate calculation 1. Identify the foods that contain carbohydrates: ? Rice. ? Corn. ? Milk. ? Strawberries. 2. Calculate how many servings you have of each food: ? 2 servings rice. ? 1 serving corn. ? 1 serving milk. ? 1 serving strawberries. 3. Multiply each number of servings by 15 g: ? 2 servings rice x 15 g = 30 g. ? 1 serving corn x 15 g = 15 g. ? 1 serving milk x 15 g = 15 g. ? 1 serving strawberries x 15 g = 15 g. 4. Add together all of the amounts to find the total grams of carbohydrates eaten: ? 30 g + 15 g + 15 g + 15 g = 75 g of carbohydrates total. Summary  Carbohydrate counting is a method of keeping track of how many carbohydrates you eat.  Eating carbohydrates naturally increases the amount of sugar (glucose) in the blood.  Counting how many carbohydrates you eat helps keep your blood glucose within normal limits, which helps you manage your diabetes.  A diet and nutrition specialist (registered dietitian) can help you make a meal plan and calculate how many carbohydrates you should have at each meal and snack. This information is not intended to replace advice given to you by your health care provider. Make sure you discuss any questions you have with your health care provider. Document Released: 09/04/2005 Document Revised: 03/14/2017   Document Reviewed: 02/16/2016 Elsevier Interactive Patient Education  [employer].

## 2019-02-03 NOTE — Addendum Note (Signed)
Addended by: Tito Dine on: 02/03/2019 09:47 AM   Modules accepted: Orders

## 2019-02-04 LAB — COMPREHENSIVE METABOLIC PANEL
AG Ratio: 1.3 (calc) (ref 1.0–2.5)
ALT: 11 U/L (ref 6–29)
AST: 15 U/L (ref 10–30)
Albumin: 4 g/dL (ref 3.6–5.1)
Alkaline phosphatase (APISO): 85 U/L (ref 31–125)
BUN: 8 mg/dL (ref 7–25)
CO2: 26 mmol/L (ref 20–32)
Calcium: 9.4 mg/dL (ref 8.6–10.2)
Chloride: 104 mmol/L (ref 98–110)
Creat: 0.77 mg/dL (ref 0.50–1.10)
Globulin: 3 g/dL (calc) (ref 1.9–3.7)
Glucose, Bld: 86 mg/dL (ref 65–99)
Potassium: 4.1 mmol/L (ref 3.5–5.3)
Sodium: 139 mmol/L (ref 135–146)
Total Bilirubin: 0.4 mg/dL (ref 0.2–1.2)
Total Protein: 7 g/dL (ref 6.1–8.1)

## 2019-02-04 LAB — LIPID PANEL
Cholesterol: 269 mg/dL — ABNORMAL HIGH (ref <200)
HDL: 81 mg/dL (ref >=50)
LDL Cholesterol (Calc): 169 mg/dL (calc) — ABNORMAL HIGH
Non-HDL Cholesterol (Calc): 188 mg/dL (calc) — ABNORMAL HIGH (ref <130)
Total CHOL/HDL Ratio: 3.3 (calc) (ref <5.0)
Triglycerides: 84 mg/dL (ref <150)

## 2019-02-04 LAB — PAP, TP IMAGING W/ HPV RNA, RFLX HPV TYPE 16,18/45: HPV DNA High Risk: DETECTED — AB

## 2019-02-04 LAB — CBC WITH DIFFERENTIAL/PLATELET
Absolute Monocytes: 352 cells/uL (ref 200–950)
Basophils Absolute: 21 cells/uL (ref 0–200)
Basophils Relative: 0.3 %
Eosinophils Absolute: 90 cells/uL (ref 15–500)
Eosinophils Relative: 1.3 %
HCT: 40.2 % (ref 35.0–45.0)
Hemoglobin: 13.5 g/dL (ref 11.7–15.5)
Lymphs Abs: 2905 cells/uL (ref 850–3900)
MCH: 30.4 pg (ref 27.0–33.0)
MCHC: 33.6 g/dL (ref 32.0–36.0)
MCV: 90.5 fL (ref 80.0–100.0)
MPV: 10 fL (ref 7.5–12.5)
Monocytes Relative: 5.1 %
Neutro Abs: 3533 cells/uL (ref 1500–7800)
Neutrophils Relative %: 51.2 %
Platelets: 366 10*3/uL (ref 140–400)
RBC: 4.44 10*6/uL (ref 3.80–5.10)
RDW: 13.1 % (ref 11.0–15.0)
Total Lymphocyte: 42.1 %
WBC: 6.9 10*3/uL (ref 3.8–10.8)

## 2019-02-04 LAB — C. TRACHOMATIS/N. GONORRHOEAE RNA
C. trachomatis RNA, TMA: NOT DETECTED
N. gonorrhoeae RNA, TMA: NOT DETECTED

## 2019-04-08 ENCOUNTER — Encounter: Payer: Self-pay | Admitting: Obstetrics & Gynecology

## 2019-04-08 ENCOUNTER — Other Ambulatory Visit: Payer: Self-pay

## 2019-04-08 ENCOUNTER — Ambulatory Visit: Payer: BC Managed Care – PPO | Admitting: Obstetrics & Gynecology

## 2019-04-08 VITALS — BP 110/78

## 2019-04-08 DIAGNOSIS — R87610 Atypical squamous cells of undetermined significance on cytologic smear of cervix (ASC-US): Secondary | ICD-10-CM

## 2019-04-08 DIAGNOSIS — R87810 Cervical high risk human papillomavirus (HPV) DNA test positive: Secondary | ICD-10-CM

## 2019-04-08 DIAGNOSIS — N871 Moderate cervical dysplasia: Secondary | ICD-10-CM

## 2019-04-08 NOTE — Progress Notes (Signed)
    Madison Combs 05/06/1981 [id number]        38 y.o.  G2P0011 Widowed obesity is his only goes visit he did see  RP: ASCUS/HPV 16 positive for Colposcopy  HPI: ASCUS/HPV HR pos 01/2018 and 01/2019 .  HPV 16 positive 01/2018.   OB History  Gravida Para Term Preterm AB Living  2 1     1 1   SAB TAB Ectopic Multiple Live Births               # Outcome Date GA Lbr Len/2nd Weight Sex Delivery Anes PTL Lv  2 AB           1 Para             Past medical history,surgical history, problem list, medications, allergies, family history and social history were all reviewed and documented in the EPIC chart.   Directed ROS with pertinent positives and negatives documented in the history of present illness/assessment and plan.  Exam:  Vitals:   04/08/19 1102  BP: 110/78   General appearance:  Normal  Colposcopy Procedure Note Madison Combs 04/08/2019  Indications: ASCUS/HPV 16 positive  Procedure Details  The risks and benefits of the procedure and Verbal informed consent obtained.  Speculum placed in vagina and excellent visualization of cervix achieved, cervix swabbed x 3 with acetic acid solution.  Findings:  Cervix colposcopy: Physical Exam Genitourinary:       Vaginal colposcopy: Normal  Vulvar colposcopy: Normal  Perirectal colposcopy: Grossly normal  The cervix was sprayed with Hurricane before performing the cervical biopsies.  Specimens: Cervical Bx at 12, 4 and 6 O'Clock.  Complications:  None, hemostasis with Silver Nitrate and Moncel. . Plan:  Management per results.   Assessment/Plan:  38 y.o. G2P0011   1. 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  HPV 16+ May 2019.  Colposcopy procedure counseling done.  Colposcopy findings reviewed with patient.  Probable severe dysplasia, pending cervical biopsies.  Management per results, LEEP procedure discussed with patient.  Other  orders - Pathology Report ([facility])  Counseling on above issues and coordination of care more than 50% for 10 minutes.  Madison Bruins MD, 11:48 AM 04/08/2019

## 2019-04-09 ENCOUNTER — Encounter: Payer: Self-pay | Admitting: Obstetrics & Gynecology

## 2019-04-09 NOTE — Patient Instructions (Signed)
1. 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  HPV 16+ May 2019.  Colposcopy procedure counseling done.  Colposcopy findings reviewed with patient.  Probable severe dysplasia, pending cervical biopsies.  Management per results, LEEP procedure discussed with patient.  Other orders - Pathology Report ([facility])  Johnelle, it was a pleasure seeing you today!  I will inform you of your results as soon as they are available.

## 2019-04-11 LAB — TISSUE PATH REPORT 10802

## 2019-04-11 LAB — PATHOLOGY REPORT

## 2019-05-14 ENCOUNTER — Encounter: Payer: Self-pay | Admitting: Obstetrics & Gynecology

## 2019-05-14 ENCOUNTER — Ambulatory Visit: Payer: BC Managed Care – PPO | Admitting: Obstetrics & Gynecology

## 2019-05-14 ENCOUNTER — Other Ambulatory Visit: Payer: Self-pay

## 2019-05-14 VITALS — BP 140/88

## 2019-05-14 DIAGNOSIS — D069 Carcinoma in situ of cervix, unspecified: Secondary | ICD-10-CM

## 2019-05-14 DIAGNOSIS — N871 Moderate cervical dysplasia: Secondary | ICD-10-CM

## 2019-05-14 DIAGNOSIS — R87810 Cervical high risk human papillomavirus (HPV) DNA test positive: Secondary | ICD-10-CM

## 2019-05-14 NOTE — Progress Notes (Signed)
Sheralyn BoatmanDanielle M Kimple Sep 21, 1980 [id number]        38 y.o.  G2P1A1L1  Daughter is 38 yo, I delivered her.  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   OB History  Gravida Para Term Preterm AB Living  2 1     1 1   SAB TAB Ectopic Multiple Live Births               # Outcome Date GA Lbr Len/2nd Weight Sex Delivery Anes PTL Lv  2 AB           1 Para             Past medical history,surgical history, problem list, medications, allergies, family history and social history were all reviewed and documented in the EPIC chart.   Directed ROS with pertinent positives and negatives documented in the history of present illness/assessment and plan.  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  A coated speculum was inserted into the vagina and colposcopic examination was performed with 4% acidic acid with findings noted above. Approximately 20cc's of 1% Lidocaine with epinephrine was infiltrated for a paracervical block at 4 and 8 O'Clock and deep near the outer margin of the transformation zone circumferentially at 12, 3, 6, and 9 o'clock positions for the intracervical local anesthesia. [facility] was then turned on after the patient was grounded with pad electrode on her  thigh and jewelry removed.  The settings on the generator were Blend 1 current 70 watts cut and 70 watts on the coagulation mode.  A white loop electrode was utilized to exercise the atypical transformation zone.  The tip of the electrode was placed 3 mm from the edge of the lesion at 4-6 O'Clock to the endocervix, then from 9-12 to the endocervix, then from 1-3 O'Clock to the endocervix and finally from 7-8 O'Clock to the Endocervix.  The specimens were well identifieddf.  The loop electrode was replaced with ball electrode set at 50 watts and the base of the crater was fulgurated circumferentially.  Monsell's paint was then applied for additional hemostasis.  The specimens were placed in formation fixative for pathology evaluation.  Patient tolerated the procedure well with minimal blood loss and without any complications.  After the procedure patient left office with stable vital signs and instructions sheet.   Assessment/Plan:  38 y.o. G2P0011   1. 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  Colposcopy done prior to LEEP today.  LEEP procedure explained to patient and questions answered.  A paracervical block with intracervical local anesthesia were done.  LEEP procedure without complication and well-tolerated by patient.  Good hemostasis with the electrocautery ball and Monsel.  Specimens sent to pathology.  Management post LEEP per pathology results.  2.  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   Princess Bruins MD, 2:57 PM 05/14/2019

## 2019-05-15 ENCOUNTER — Encounter: Payer: Self-pay | Admitting: Obstetrics & Gynecology

## 2019-05-15 NOTE — Patient Instructions (Signed)
1. 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  Colposcopy done prior to LEEP today.  LEEP procedure explained to patient and questions answered.  A paracervical block with intracervical local anesthesia were done.  LEEP procedure without complication and well-tolerated by patient.  Good hemostasis with the electrocautery ball and Monsel.  Specimens sent to pathology.  Management post LEEP per pathology results.  2. 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  Madison Combs, it was a pleasure seeing you today!  I will inform you of your results as soon as they are available.

## 2019-05-16 LAB — PATHOLOGY REPORT

## 2019-05-16 LAB — TISSUE, 5 SPECIMENS

## 2019-09-02 ENCOUNTER — Other Ambulatory Visit: Payer: BC Managed Care – PPO

## 2019-09-03 ENCOUNTER — Other Ambulatory Visit: Payer: BC Managed Care – PPO

## 2019-09-04 ENCOUNTER — Ambulatory Visit: Payer: BC Managed Care – PPO | Attending: Internal Medicine

## 2019-09-04 DIAGNOSIS — Z20822 Contact with and (suspected) exposure to covid-19: Secondary | ICD-10-CM

## 2019-09-06 LAB — NOVEL CORONAVIRUS, NAA: SARS-CoV-2, NAA: NOT DETECTED

## 2019-09-23 ENCOUNTER — Encounter: Payer: BC Managed Care – PPO | Admitting: Obstetrics & Gynecology

## 2019-09-23 ENCOUNTER — Ambulatory Visit: Payer: BC Managed Care – PPO | Attending: Internal Medicine

## 2019-09-23 DIAGNOSIS — Z20822 Contact with and (suspected) exposure to covid-19: Secondary | ICD-10-CM

## 2019-09-24 ENCOUNTER — Other Ambulatory Visit: Payer: Self-pay

## 2019-09-24 ENCOUNTER — Ambulatory Visit: Payer: BC Managed Care – PPO | Admitting: Women's Health

## 2019-09-24 ENCOUNTER — Encounter: Payer: Self-pay | Admitting: Women's Health

## 2019-09-24 VITALS — BP 118/80

## 2019-09-24 DIAGNOSIS — R350 Frequency of micturition: Secondary | ICD-10-CM | POA: Diagnosis not present

## 2019-09-24 MED ORDER — SULFAMETHOXAZOLE-TRIMETHOPRIM 800-160 MG PO TABS: 1.0000 | ORAL_TABLET | Freq: Two times a day (BID) | ORAL | 0 refills | Status: DC

## 2019-09-24 NOTE — Patient Instructions (Signed)

## 2019-09-24 NOTE — Progress Notes (Signed)
39 year old W BF G2 P1 presents with complaint of increased urinary frequency, increased nocturia, burning sensation with urination and constant sense of urgency for the past 2 days.  Denies any vaginal discharge, abdominal/back pain or fever.  Not sexually active, monthly cycle on Ortho Evra patch.  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  Exam: Appears well.  No CVAT.  Abdomen soft, nontender, no rebound or radiation of pain.  External genitalia within normal limits, speculum exam no discharge or erythema noted, bimanual no CMT or adnexal tenderness. UA: Trace blood, trace leukocytes, 6-10 WBCs, 0-2 RBCs, moderate bacteria  Probable UTI  Plan: Reviewed waiting for culture results for treating now for first treatment.  Septra twice daily for 3 days prescription, proper use given and reviewed.  UTI prevention discussed.  Instructed to call if symptoms persist

## 2019-09-25 LAB — NOVEL CORONAVIRUS, NAA: SARS-CoV-2, NAA: NOT DETECTED

## 2019-09-26 LAB — URINALYSIS, COMPLETE W/RFL CULTURE
Bilirubin Urine: NEGATIVE
Glucose, UA: NEGATIVE
Hyaline Cast: NONE SEEN /LPF
Nitrites, Initial: NEGATIVE
Protein, ur: NEGATIVE
Specific Gravity, Urine: 1.025 (ref 1.001–1.03)
pH: 5.5 (ref 5.0–8.0)

## 2019-09-26 LAB — URINE CULTURE
MICRO NUMBER:: 10013088
Result:: NO GROWTH
SPECIMEN QUALITY:: ADEQUATE

## 2019-09-26 LAB — CULTURE INDICATED

## 2019-10-02 ENCOUNTER — Ambulatory Visit: Payer: BC Managed Care – PPO | Attending: Internal Medicine

## 2019-10-02 DIAGNOSIS — Z20822 Contact with and (suspected) exposure to covid-19: Secondary | ICD-10-CM

## 2019-10-03 LAB — NOVEL CORONAVIRUS, NAA: SARS-CoV-2, NAA: NOT DETECTED

## 2019-10-07 ENCOUNTER — Telehealth: Payer: Self-pay | Admitting: *Deleted

## 2019-10-07 NOTE — Telephone Encounter (Signed)
Okay for letter to state that she is having body aches and no  cough with a negative Covid test on 10/02/2019.  Pending negative Covid test this week plans to return to work 10/13/2019.  Have Alexxa treat the symptoms with rest, fluids, Tylenol alternating with Motrin.  I am assuming she has no urinary symptoms?  Fever free?  If she is fever free also include in the letter.

## 2019-10-07 NOTE — Telephone Encounter (Signed)
Patient called requesting a return to work letter, no PCP, has you listed as PCP. Patient stated having body aches and back spasms with cough last week on 10/02/19. Did covid testing with [facility] on 10/02/19 and tested negative but still has body aches and back spasms, no cough, no fever. Works for [employer] and they are requesting a note. Patient said she planning to get retested for covid again this week, and would return to work on 10/13/19. If you approve letter please let me know what you would like letter to say.  Please advise

## 2019-10-07 NOTE — Telephone Encounter (Signed)
Patient informed, no fever has treated the symptoms. Patient will call on Friday after having another Covid test then letter will be printed and patient will come pick up letter.

## 2019-10-08 ENCOUNTER — Ambulatory Visit: Payer: BC Managed Care – PPO | Attending: Internal Medicine

## 2019-10-08 DIAGNOSIS — Z20822 Contact with and (suspected) exposure to covid-19: Secondary | ICD-10-CM

## 2019-10-09 LAB — NOVEL CORONAVIRUS, NAA: SARS-CoV-2, NAA: NOT DETECTED

## 2019-10-21 ENCOUNTER — Encounter: Payer: BC Managed Care – PPO | Admitting: Obstetrics & Gynecology

## 2019-10-22 ENCOUNTER — Ambulatory Visit: Payer: BC Managed Care – PPO | Attending: Internal Medicine

## 2019-10-22 DIAGNOSIS — Z20822 Contact with and (suspected) exposure to covid-19: Secondary | ICD-10-CM

## 2019-10-23 LAB — NOVEL CORONAVIRUS, NAA: SARS-CoV-2, NAA: NOT DETECTED

## 2019-11-17 ENCOUNTER — Other Ambulatory Visit: Payer: Self-pay

## 2019-11-17 ENCOUNTER — Encounter: Payer: Self-pay | Admitting: Obstetrics & Gynecology

## 2019-11-17 ENCOUNTER — Ambulatory Visit: Payer: BC Managed Care – PPO | Admitting: Obstetrics & Gynecology

## 2019-11-17 VITALS — BP 140/80

## 2019-11-17 DIAGNOSIS — R87810 Cervical high risk human papillomavirus (HPV) DNA test positive: Secondary | ICD-10-CM | POA: Diagnosis not present

## 2019-11-17 DIAGNOSIS — D069 Carcinoma in situ of cervix, unspecified: Secondary | ICD-10-CM | POA: Diagnosis not present

## 2019-11-17 DIAGNOSIS — Z9889 Other specified postprocedural states: Secondary | ICD-10-CM

## 2019-11-17 NOTE — Progress Notes (Signed)
    Madison Combs April 05, 1981 [id number]        39 y.o.  G2P0011   RP: CIN 3 Margins negative on LEEP 04/2019 with HPV 16 positive for Colposcopy  HPI: CINE 3 Margins negative on LEEP 04/2019.  HPV 16 positive.   OB History  Gravida Para Term Preterm AB Living  2 1     1 1   SAB TAB Ectopic Multiple Live Births               # Outcome Date GA Lbr Len/2nd Weight Sex Delivery Anes PTL Lv  2 AB           1 Para             Past medical history,surgical history, problem list, medications, allergies, family history and social history were all reviewed and documented in the EPIC chart.   Directed ROS with pertinent positives and negatives documented in the history of present illness/assessment and plan.  Exam:  Vitals:   11/17/19 1444  BP: 140/80   General appearance:  Normal  Colposcopy Procedure Note JOHN VASCONCELOS 11/17/2019  Indications:  CIN 3 margins negative on LEEP 04/2019 and HPV 16 positive  Procedure Details  The risks and benefits of the procedure and Verbal informed consent obtained.  Speculum placed in vagina and excellent visualization of cervix achieved, cervix swabbed x 3 with acetic acid solution.  Findings:  Cervix colposcopy: Physical Exam Genitourinary:       Vaginal colposcopy: Normal  Vulvar colposcopy: Normal  Perirectal colposcopy: Normal  The cervix was sprayed with Hurricane before performing the cervical biopsies.  Specimens: Pap/HPV HR and Cervical biopsies at 1 and 4 O'Clock.  Complications:  None, good hemostasis with Silver Nitrate . Plan:  Management per cervical Bx results    Assessment/Plan:  39 y.o. G2P0011   1. 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  Margins negative.  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  Pap test with HPV high-risk done.  Colposcopy repeated today.  Very mild acetowhite with punctation.  Cervical biopsies taken.  Management per results.  Colposcopy findings reviewed with patient.   Postprocedure precautions discussed.  2. H/O LEEP As above.  3. 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  High risk HPV repeated today.  Other orders - PAP,TP IMGw/HPV RNA,rflx September 2020 - Pathology Report ([facility])  [id number] MD, 2:56 PM 11/17/2019

## 2019-11-17 NOTE — Patient Instructions (Signed)
1. 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  Margins negative.  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  Pap test with HPV high-risk done.  Colposcopy repeated today.  Very mild acetowhite with punctation.  Cervical biopsies taken.  Management per results.  Colposcopy findings reviewed with patient.  Postprocedure precautions discussed.  2. H/O LEEP As above.  3. 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  High risk HPV repeated today.  Other orders - PAP,TP IMGw/HPV RNA,rflx [id number],98/42 - Pathology Report ([facility])  Briell, it was a pleasure seeing you today!  I will inform you of your results as soon as they are available.

## 2019-11-19 LAB — PAP, TP IMAGING W/ HPV RNA, RFLX HPV TYPE 16,18/45: HPV DNA High Risk: NOT DETECTED

## 2019-11-19 LAB — TISSUE PATH REPORT

## 2019-11-19 LAB — PATHOLOGY REPORT

## 2019-12-18 ENCOUNTER — Other Ambulatory Visit: Payer: Self-pay

## 2019-12-22 ENCOUNTER — Ambulatory Visit (INDEPENDENT_AMBULATORY_CARE_PROVIDER_SITE_OTHER): Payer: BC Managed Care – PPO | Admitting: Women's Health

## 2019-12-22 ENCOUNTER — Other Ambulatory Visit: Payer: Self-pay

## 2019-12-22 ENCOUNTER — Encounter: Payer: Self-pay | Admitting: Women's Health

## 2019-12-22 VITALS — BP 132/84

## 2019-12-22 DIAGNOSIS — R350 Frequency of micturition: Secondary | ICD-10-CM | POA: Diagnosis not present

## 2019-12-22 DIAGNOSIS — N3000 Acute cystitis without hematuria: Secondary | ICD-10-CM

## 2019-12-22 MED ORDER — SULFAMETHOXAZOLE-TRIMETHOPRIM 800-160 MG PO TABS: 1.0000 | ORAL_TABLET | Freq: Two times a day (BID) | ORAL | 0 refills | Status: DC

## 2019-12-22 NOTE — Patient Instructions (Addendum)
It was good to see you today Vitamin D 1000 IUs daily Mamm[facility] [phone]  Urinary Tract Infection, Adult  A urinary tract infection (UTI) is an infection of any part of the urinary tract. The urinary tract includes the kidneys, ureters, bladder, and urethra. These organs make, store, and get rid of urine in the body. Your health care provider may use other names to describe the infection. An upper UTI affects the ureters and kidneys (pyelonephritis). A lower UTI affects the bladder (cystitis) and urethra (urethritis). What are the causes? Most urinary tract infections are caused by bacteria in your genital area, around the entrance to your urinary tract (urethra). These bacteria grow and cause inflammation of your urinary tract. What increases the risk? You are more likely to develop this condition if:  You have a urinary catheter that stays in place (indwelling).  You are not able to control when you urinate or have a bowel movement (you have incontinence).  You are female and you: ? Use a spermicide or diaphragm for birth control. ? Have low estrogen levels. ? Are pregnant.  You have certain genes that increase your risk (genetics).  You are sexually active.  You take antibiotic medicines.  You have a condition that causes your flow of urine to slow down, such as: ? An enlarged prostate, if you are female. ? Blockage in your urethra (stricture). ? A kidney stone. ? A nerve condition that affects your bladder control (neurogenic bladder). ? Not getting enough to drink, or not urinating often.  You have certain medical conditions, such as: ? Diabetes. ? A weak disease-fighting system (immunesystem). ? Sickle cell disease. ? Gout. ? Spinal cord injury. What are the signs or symptoms? Symptoms of this condition include:  Needing to urinate right away (urgently).  Frequent urination or passing small amounts of urine frequently.  Pain or burning with  urination.  Blood in the urine.  Urine that smells bad or unusual.  Trouble urinating.  Cloudy urine.  Vaginal discharge, if you are female.  Pain in the abdomen or the lower back. You may also have:  Vomiting or a decreased appetite.  Confusion.  Irritability or tiredness.  A fever.  Diarrhea. The first symptom in older adults may be confusion. In some cases, they may not have any symptoms until the infection has worsened. How is this diagnosed? This condition is diagnosed based on your medical history and a physical exam. You may also have other tests, including:  Urine tests.  Blood tests.  Tests for sexually transmitted infections (STIs). If you have had more than one UTI, a cystoscopy or imaging studies may be done to determine the cause of the infections. How is this treated? Treatment for this condition includes:  Antibiotic medicine.  Over-the-counter medicines to treat discomfort.  Drinking enough water to stay hydrated. If you have frequent infections or have other conditions such as a kidney stone, you may need to see a health care provider who specializes in the urinary tract (urologist). In rare cases, urinary tract infections can cause sepsis. Sepsis is a life-threatening condition that occurs when the body responds to an infection. Sepsis is treated in the hospital with IV antibiotics, fluids, and other medicines. Follow these instructions at home:  Medicines  Take over-the-counter and prescription medicines only as told by your health care provider.  If you were prescribed an antibiotic medicine, take it as told by your health care provider. Do not stop using the antibiotic  even if you start to feel better. General instructions  Make sure you: ? Empty your bladder often and completely. Do not hold urine for long periods of time. ? Empty your bladder after sex. ? Wipe from front to back after a bowel movement if you are female. Use each tissue  one time when you wipe.  Drink enough fluid to keep your urine pale yellow.  Keep all follow-up visits as told by your health care provider. This is important. Contact a health care provider if:  Your symptoms do not get better after 1-2 days.  Your symptoms go away and then return. Get help right away if you have:  Severe pain in your back or your lower abdomen.  A fever.  Nausea or vomiting. Summary  A urinary tract infection (UTI) is an infection of any part of the urinary tract, which includes the kidneys, ureters, bladder, and urethra.  Most urinary tract infections are caused by bacteria in your genital area, around the entrance to your urinary tract (urethra).  Treatment for this condition often includes antibiotic medicines.  If you were prescribed an antibiotic medicine, take it as told by your health care provider. Do not stop using the antibiotic even if you start to feel better.  Keep all follow-up visits as told by your health care provider. This is important. This information is not intended to replace advice given to you by your health care provider. Make sure you discuss any questions you have with your health care provider. Document Revised: 08/22/2018 Document Reviewed: 03/14/2018 Elsevier Patient Education  2020 [employer].

## 2019-12-22 NOTE — Progress Notes (Signed)
Madison Combs 26-Jun-1981 [id number]  History: 39 year old WBF G2P1 presents today with complaints of burning with urination, especially at end of stream of urination, increased frequency nocturia 2-3 times per night for the past week.  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 Denies vaginal itching and odor, abdominal pain, and flank pain. Not currently sexually active, denies need for STD screen.  Monthly cycle on Xulane patches.  04/2019 LEEP for CIN-3 margins negative, follow-up Pap 11/2019 normal.  Exam: Appears well, comfortable. External genitalia within normal limits, no vaginal redness or discharge No CVA pain with palpation No abdominal guarding with palpation  UA: +1 leukocytes, negative nitrites, 10-20 WBCs, no RBCs, many bacteria  UTI  Plan: Bactrim DS 800-160mg  2 times daily for 3 days Pending urine culture Encouraged to drink more water and prioritize bathroom breaks Follow up as needed if symptoms worsen or do not improve after treatment

## 2019-12-24 LAB — URINALYSIS, COMPLETE W/RFL CULTURE
Bilirubin Urine: NEGATIVE
Glucose, UA: NEGATIVE
Hgb urine dipstick: NEGATIVE
Hyaline Cast: NONE SEEN /LPF
Ketones, ur: NEGATIVE
Nitrites, Initial: NEGATIVE
Protein, ur: NEGATIVE
RBC / HPF: NONE SEEN /HPF (ref 0–2)
Specific Gravity, Urine: 1.02 (ref 1.001–1.03)
pH: 7 (ref 5.0–8.0)

## 2019-12-24 LAB — CULTURE INDICATED

## 2019-12-24 LAB — URINE CULTURE
MICRO NUMBER:: 10327204
Result:: NO GROWTH
SPECIMEN QUALITY:: ADEQUATE

## 2019-12-25 ENCOUNTER — Other Ambulatory Visit: Payer: Self-pay | Admitting: Women's Health

## 2019-12-25 DIAGNOSIS — Z3045 Encounter for surveillance of transdermal patch hormonal contraceptive device: Secondary | ICD-10-CM

## 2019-12-25 NOTE — Telephone Encounter (Signed)
CE scheduled for 02/04/20.

## 2020-01-07 NOTE — Telephone Encounter (Signed)
Spoke with patient and read her this unread My Chart message.

## 2020-02-03 ENCOUNTER — Encounter: Payer: BC Managed Care – PPO | Admitting: Nurse Practitioner

## 2020-02-04 ENCOUNTER — Encounter: Payer: BC Managed Care – PPO | Admitting: Nurse Practitioner

## 2020-02-09 ENCOUNTER — Other Ambulatory Visit: Payer: Self-pay

## 2020-02-09 ENCOUNTER — Encounter: Payer: Self-pay | Admitting: Nurse Practitioner

## 2020-02-09 ENCOUNTER — Ambulatory Visit: Payer: BC Managed Care – PPO | Admitting: Nurse Practitioner

## 2020-02-09 VITALS — BP 118/80 | Ht 65.0 in | Wt 182.0 lb

## 2020-02-09 DIAGNOSIS — Z01419 Encounter for gynecological examination (general) (routine) without abnormal findings: Secondary | ICD-10-CM

## 2020-02-09 DIAGNOSIS — Z3045 Encounter for surveillance of transdermal patch hormonal contraceptive device: Secondary | ICD-10-CM | POA: Diagnosis not present

## 2020-02-09 DIAGNOSIS — N870 Mild cervical dysplasia: Secondary | ICD-10-CM

## 2020-02-09 DIAGNOSIS — Z13220 Encounter for screening for lipoid disorders: Secondary | ICD-10-CM | POA: Diagnosis not present

## 2020-02-09 DIAGNOSIS — D069 Carcinoma in situ of cervix, unspecified: Secondary | ICD-10-CM

## 2020-02-09 MED ORDER — XULANE 150-35 MCG/24HR TD PTWK: MEDICATED_PATCH | TRANSDERMAL | 0 refills | Status: DC

## 2020-02-09 NOTE — Progress Notes (Signed)
   Madison Combs 08/15/1981 [id number]   History:  39 y.o. G2 P1 presents for annual exam.  May 2018 Pap with negative cytology, positive HPV.  Did not follow-up for recommended colposcopy.  May 2020 Pap - ASCUS with positive HPV, 05/14/2019 LEEP -CIN-3 with negative margins, positive HPV 16.  Most recent Pap in March was normal.  Monthly cycle on Ortho Evra patch with occasional spotting. Not currently sexually active.   Gynecologic History Patient's last menstrual period was 02/04/2020. Period Cycle (Days): 28 Period Duration (Days): 5 Period Pattern: Regular Menstrual Flow: Moderate Menstrual Control: Maxi pad, Tampon Dysmenorrhea: (!) Mild Dysmenorrhea Symptoms: Cramping Contraception: Ortho-Evra patches weekly Last Pap: 11/17/2019. Results were: normal Last mammogram: 09/15/2014. Results were: normal   Past medical history, past surgical history, family history and social history were all reviewed and documented in the EPIC chart. Daughter 60. Husband passed away in 01-21-2015 with neuroendocrine cancer.  ROS:  A ROS was performed and pertinent positives and negatives are included.  Exam:  Vitals:   02/09/20 1607  BP: 118/80  Weight: 182 lb (82.6 kg)  Height: 5\' 5"  (1.651 m)   Body mass index is 30.29 kg/m.  General appearance:  Normal Thyroid:  Symmetrical, normal in size, without palpable masses or nodularity. Respiratory  Auscultation:  Clear without wheezing or rhonchi Cardiovascular  Auscultation:  Regular rate, without rubs, murmurs or gallops  Edema/varicosities:  Not grossly evident Abdominal  Soft,nontender, without masses, guarding or rebound.  Liver/spleen:  No organomegaly noted  Hernia:  None appreciated  Skin  Inspection:  Grossly normal   Breasts: Examined lying and sitting.   Right: Without masses, retractions, discharge or axillary adenopathy.   Left: Without masses, retractions, discharge or axillary adenopathy. Gentitourinary   Inguinal/mons:   Normal without inguinal adenopathy  External genitalia:  Normal  BUS/Urethra/Skene's glands:  Normal  Vagina:  Normal  Cervix:  Normal, scarring present  Uterus:  Normal in size, shape and contour.  Midline and mobile  Adnexa/parametria:     Rt: Without masses or tenderness.   Lt: Without masses or tenderness.  Anus and perineum: Normal  Digital rectal exam: Normal sphincter tone without palpated masses or tenderness  Assessment/Plan:  39 y.o.  for annual exam.   Well female exam with routine gynecological exam - Education provided on SBEs, importance of preventative screenings, current guidelines, high calcium diet, regular exercise, and multivitamin daily. Will start mammogram screenings next year. Patient requesting labs in September when she comes for repeat pap, not fasting today.  Severe dysplasia of cervix (CIN III) - repeat pap in September  Encounter for surveillance of transdermal patch hormonal contraceptive device - doing well on these and would like to continue use  Follow up in 1 year for annual     [facility], 7:45 AM 02/10/2020

## 2020-02-09 NOTE — Patient Instructions (Addendum)
Health Maintenance, Female Adopting a healthy lifestyle and getting preventive care are important in promoting health and wellness. Ask your health care provider about:  The right schedule for you to have regular tests and exams.  Things you can do on your own to prevent diseases and keep yourself healthy. What should I know about diet, weight, and exercise? Eat a healthy diet   Eat a diet that includes plenty of vegetables, fruits, low-fat dairy products, and lean protein.  Do not eat a lot of foods that are high in solid fats, added sugars, or sodium. Maintain a healthy weight Body mass index (BMI) is used to identify weight problems. It estimates body fat based on height and weight. Your health care provider can help determine your BMI and help you achieve or maintain a healthy weight. Get regular exercise Get regular exercise. This is one of the most important things you can do for your health. Most adults should:  Exercise for at least 150 minutes each week. The exercise should increase your heart rate and make you sweat (moderate-intensity exercise).  Do strengthening exercises at least twice a week. This is in addition to the moderate-intensity exercise.  Spend less time sitting. Even light physical activity can be beneficial. Watch cholesterol and blood lipids Have your blood tested for lipids and cholesterol at 39 years of age, then have this test every 5 years. Have your cholesterol levels checked more often if:  Your lipid or cholesterol levels are high.  You are older than 40 years of age.  You are at high risk for heart disease. What should I know about cancer screening? Depending on your health history and family history, you may need to have cancer screening at various ages. This may include screening for:  Breast cancer.  Cervical cancer.  Colorectal cancer.  Skin cancer.  Lung cancer. What should I know about heart disease, diabetes, and high blood  pressure? Blood pressure and heart disease  High blood pressure causes heart disease and increases the risk of stroke. This is more likely to develop in people who have high blood pressure readings, are of African descent, or are overweight.  Have your blood pressure checked: ? Every 3-5 years if you are 18-39 years of age. ? Every year if you are 40 years old or older. Diabetes Have regular diabetes screenings. This checks your fasting blood sugar level. Have the screening done:  Once every three years after age 40 if you are at a normal weight and have a low risk for diabetes.  More often and at a younger age if you are overweight or have a high risk for diabetes. What should I know about preventing infection? Hepatitis B If you have a higher risk for hepatitis B, you should be screened for this virus. Talk with your health care provider to find out if you are at risk for hepatitis B infection. Hepatitis C Testing is recommended for:  Everyone born from 1945 through 1965.  Anyone with known risk factors for hepatitis C. Sexually transmitted infections (STIs)  Get screened for STIs, including gonorrhea and chlamydia, if: ? You are sexually active and are younger than 39 years of age. ? You are older than 39 years of age and your health care provider tells you that you are at risk for this type of infection. ? Your sexual activity has changed since you were last screened, and you are at increased risk for chlamydia or gonorrhea. Ask your health care provider if   you are at risk.  Ask your health care provider about whether you are at high risk for HIV. Your health care provider may recommend a prescription medicine to help prevent HIV infection. If you choose to take medicine to prevent HIV, you should first get tested for HIV. You should then be tested every 3 months for as long as you are taking the medicine. Pregnancy  If you are about to stop having your period (premenopausal) and  you may become pregnant, seek counseling before you get pregnant.  Take 400 to 800 micrograms (mcg) of folic acid every day if you become pregnant.  Ask for birth control (contraception) if you want to prevent pregnancy. Osteoporosis and menopause Osteoporosis is a disease in which the bones lose minerals and strength with aging. This can result in bone fractures. If you are 65 years old or older, or if you are at risk for osteoporosis and fractures, ask your health care provider if you should:  Be screened for bone loss.  Take a calcium or vitamin D supplement to lower your risk of fractures.  Be given hormone replacement therapy (HRT) to treat symptoms of menopause. Follow these instructions at home: Lifestyle  Do not use any products that contain nicotine or tobacco, such as cigarettes, e-cigarettes, and chewing tobacco. If you need help quitting, ask your health care provider.  Do not use street drugs.  Do not share needles.  Ask your health care provider for help if you need support or information about quitting drugs. Alcohol use  Do not drink alcohol if: ? Your health care provider tells you not to drink. ? You are pregnant, may be pregnant, or are planning to become pregnant.  If you drink alcohol: ? Limit how much you use to 0-1 drink a day. ? Limit intake if you are breastfeeding.  Be aware of how much alcohol is in your drink. In the [place] one drink equals one 12 oz bottle of beer (355 mL), one 5 oz glass of wine (148 mL), or one 1 oz glass of hard liquor (44 mL). General instructions  Schedule regular health, dental, and eye exams.  Stay current with your vaccines.  Tell your health care provider if: ? You often feel depressed. ? You have ever been abused or do not feel safe at home. Summary  Adopting a healthy lifestyle and getting preventive care are important in promoting health and wellness.  Follow your health care provider's instructions about healthy  diet, exercising, and getting tested or screened for diseases.  Follow your health care provider's instructions on monitoring your cholesterol and blood pressure. This information is not intended to replace advice given to you by your health care provider. Make sure you discuss any questions you have with your health care provider. Document Revised: 08/28/2018 Document Reviewed: 08/28/2018 Elsevier Patient Education  2020 [employer]  Menopause Menopause is the normal time of life when menstrual periods stop completely. It is usually confirmed by 12 months without a menstrual period. The transition to menopause (perimenopause) most often happens between the ages of 45 and 55. During perimenopause, hormone levels change in your body, which can cause symptoms and affect your health. Menopause may increase your risk for:  Loss of bone (osteoporosis), which causes bone breaks (fractures).  Depression.  Hardening and narrowing of the arteries (atherosclerosis), which can cause heart attacks and strokes. What are the causes? This condition is usually caused by a natural change in hormone levels that happens as you   get older. The condition may also be caused by surgery to remove both ovaries (bilateral oophorectomy). What increases the risk? This condition is more likely to start at an earlier age if you have certain medical conditions or treatments, including:  A tumor of the pituitary gland in the brain.  A disease that affects the ovaries and hormone production.  Radiation treatment for cancer.  Certain cancer treatments, such as chemotherapy or hormone (anti-estrogen) therapy.  Heavy smoking and excessive alcohol use.  Family history of early menopause. This condition is also more likely to develop earlier in women who are very thin. What are the signs or symptoms? Symptoms of this condition include:  Hot flashes.  Irregular menstrual periods.  Night sweats.  Changes in  feelings about sex. This could be a decrease in sex drive or an increased comfort around your sexuality.  Vaginal dryness and thinning of the vaginal walls. This may cause painful intercourse.  Dryness of the skin and development of wrinkles.  Headaches.  Problems sleeping (insomnia).  Mood swings or irritability.  Memory problems.  Weight gain.  Hair growth on the face and chest.  Bladder infections or problems with urinating. How is this diagnosed? This condition is diagnosed based on your medical history, a physical exam, your age, your menstrual history, and your symptoms. Hormone tests may also be done. How is this treated? In some cases, no treatment is needed. You and your health care provider should make a decision together about whether treatment is necessary. Treatment will be based on your individual condition and preferences. Treatment for this condition focuses on managing symptoms. Treatment may include:  Menopausal hormone therapy (MHT).  Medicines to treat specific symptoms or complications.  Acupuncture.  Vitamin or herbal supplements. Before starting treatment, make sure to let your health care provider know if you have a personal or family history of:  Heart disease.  Breast cancer.  Blood clots.  Diabetes.  Osteoporosis. Follow these instructions at home: Lifestyle  Do not use any products that contain nicotine or tobacco, such as cigarettes and e-cigarettes. If you need help quitting, ask your health care provider.  Get at least 30 minutes of physical activity on 5 or more days each week.  Avoid alcoholic and caffeinated beverages, as well as spicy foods. This may help prevent hot flashes.  Get 7-8 hours of sleep each night.  If you have hot flashes, try: ? Dressing in layers. ? Avoiding things that may trigger hot flashes, such as spicy food, warm places, or stress. ? Taking slow, deep breaths when a hot flash starts. ? Keeping a fan in  your home and office.  Find ways to manage stress, such as deep breathing, meditation, or journaling.  Consider going to group therapy with other women who are having menopause symptoms. Ask your health care provider about recommended group therapy meetings. Eating and drinking  Eat a healthy, balanced diet that contains whole grains, lean protein, low-fat dairy, and plenty of fruits and vegetables.  Your health care provider may recommend adding more soy to your diet. Foods that contain soy include tofu, tempeh, and soy milk.  Eat plenty of foods that contain calcium and vitamin D for bone health. Items that are rich in calcium include low-fat milk, yogurt, beans, almonds, sardines, broccoli, and kale. Medicines  Take over-the-counter and prescription medicines only as told by your health care provider.  Talk with your health care provider before starting any herbal supplements. If prescribed, take vitamins and supplements   as told by your health care provider. These may include: ? Calcium. Women age 51 and older should get 1,200 mg (milligrams) of calcium every day. ? Vitamin D. Women need 600-800 International Units of vitamin D each day. ? Vitamins B12 and B6. Aim for 50 micrograms of B12 and 1.5 mg of B6 each day. General instructions  Keep track of your menstrual periods, including: ? When they occur. ? How heavy they are and how long they last. ? How much time passes between periods.  Keep track of your symptoms, noting when they start, how often you have them, and how long they last.  Use vaginal lubricants or moisturizers to help with vaginal dryness and improve comfort during sex.  Keep all follow-up visits as told by your health care provider. This is important. This includes any group therapy or counseling. Contact a health care provider if:  You are still having menstrual periods after age 55.  You have pain during sex.  You have not had a period for 12 months and  you develop vaginal bleeding. Get help right away if:  You have: ? Severe depression. ? Excessive vaginal bleeding. ? Pain when you urinate. ? A fast or irregular heart beat (palpitations). ? Severe headaches. ? Abdomen (abdominal) pain or severe indigestion.  You fell and you think you have a broken bone.  You develop leg or chest pain.  You develop vision problems.  You feel a lump in your breast. Summary  Menopause is the normal time of life when menstrual periods stop completely. It is usually confirmed by 12 months without a menstrual period.  The transition to menopause (perimenopause) most often happens between the ages of 45 and 55.  Symptoms can be managed through medicines, lifestyle changes, and complementary therapies such as acupuncture.  Eat a balanced diet that is rich in nutrients to promote bone health and heart health and to manage symptoms during menopause. This information is not intended to replace advice given to you by your health care provider. Make sure you discuss any questions you have with your health care provider. Document Revised: 08/17/2017 Document Reviewed: 10/07/2016 Elsevier Patient Education  2020 Else[employer] Inc.  

## 2020-03-23 ENCOUNTER — Ambulatory Visit: Payer: BC Managed Care – PPO | Admitting: Nurse Practitioner

## 2020-03-23 ENCOUNTER — Other Ambulatory Visit: Payer: Self-pay

## 2020-03-23 ENCOUNTER — Encounter: Payer: Self-pay | Admitting: Nurse Practitioner

## 2020-03-23 VITALS — BP 122/80

## 2020-03-23 DIAGNOSIS — Z7251 High risk heterosexual behavior: Secondary | ICD-10-CM

## 2020-03-23 DIAGNOSIS — N3000 Acute cystitis without hematuria: Secondary | ICD-10-CM

## 2020-03-23 DIAGNOSIS — R350 Frequency of micturition: Secondary | ICD-10-CM | POA: Diagnosis not present

## 2020-03-23 DIAGNOSIS — Z113 Encounter for screening for infections with a predominantly sexual mode of transmission: Secondary | ICD-10-CM

## 2020-03-23 DIAGNOSIS — N898 Other specified noninflammatory disorders of vagina: Secondary | ICD-10-CM

## 2020-03-23 DIAGNOSIS — B3731 Acute candidiasis of vulva and vagina: Secondary | ICD-10-CM

## 2020-03-23 DIAGNOSIS — B373 Candidiasis of vulva and vagina: Secondary | ICD-10-CM

## 2020-03-23 MED ORDER — SULFAMETHOXAZOLE-TRIMETHOPRIM 800-160 MG PO TABS: 1.0000 | ORAL_TABLET | Freq: Two times a day (BID) | ORAL | 0 refills | Status: AC

## 2020-03-23 MED ORDER — FLUCONAZOLE 150 MG PO TABS: 150.0000 mg | ORAL_TABLET | Freq: Once | ORAL | 0 refills | Status: AC

## 2020-03-23 NOTE — Patient Instructions (Signed)
Urinary Tract Infection, Adult A urinary tract infection (UTI) is an infection of any part of the urinary tract. The urinary tract includes:  The kidneys.  The ureters.  The bladder.  The urethra. These organs make, store, and get rid of pee (urine) in the body. What are the causes? This is caused by germs (bacteria) in your genital area. These germs grow and cause swelling (inflammation) of your urinary tract. What increases the risk? You are more likely to develop this condition if:  You have a small, thin tube (catheter) to drain pee.  You cannot control when you pee or poop (incontinence).  You are female, and: ? You use these methods to prevent pregnancy:  A medicine that kills sperm (spermicide).  A device that blocks sperm (diaphragm). ? You have low levels of a female hormone (estrogen). ? You are pregnant.  You have genes that add to your risk.  You are sexually active.  You take antibiotic medicines.  You have trouble peeing because of: ? A prostate that is bigger than normal, if you are female. ? A blockage in the part of your body that drains pee from the bladder (urethra). ? A kidney stone. ? A nerve condition that affects your bladder (neurogenic bladder). ? Not getting enough to drink. ? Not peeing often enough.  You have other conditions, such as: ? Diabetes. ? A weak disease-fighting system (immune system). ? Sickle cell disease. ? Gout. ? Injury of the spine. What are the signs or symptoms? Symptoms of this condition include:  Needing to pee right away (urgently).  Peeing often.  Peeing small amounts often.  Pain or burning when peeing.  Blood in the pee.  Pee that smells bad or not like normal.  Trouble peeing.  Pee that is cloudy.  Fluid coming from the vagina, if you are female.  Pain in the belly or lower back. Other symptoms include:  Throwing up (vomiting).  No urge to eat.  Feeling mixed up (confused).  Being tired  and grouchy (irritable).  A fever.  Watery poop (diarrhea). How is this treated? This condition may be treated with:  Antibiotic medicine.  Other medicines.  Drinking enough water. Follow these instructions at home:  Medicines  Take over-the-counter and prescription medicines only as told by your doctor.  If you were prescribed an antibiotic medicine, take it as told by your doctor. Do not stop taking it even if you start to feel better. General instructions  Make sure you: ? Pee until your bladder is empty. ? Do not hold pee for a long time. ? Empty your bladder after sex. ? Wipe from front to back after pooping if you are a female. Use each tissue one time when you wipe.  Drink enough fluid to keep your pee pale yellow.  Keep all follow-up visits as told by your doctor. This is important. Contact a doctor if:  You do not get better after 1-2 days.  Your symptoms go away and then come back. Get help right away if:  You have very bad back pain.  You have very bad pain in your lower belly.  You have a fever.  You are sick to your stomach (nauseous).  You are throwing up. Summary  A urinary tract infection (UTI) is an infection of any part of the urinary tract.  This condition is caused by germs in your genital area.  There are many risk factors for a UTI. These include having a small, thin   tube to drain pee and not being able to control when you pee or poop.  Treatment includes antibiotic medicines for germs.  Drink enough fluid to keep your pee pale yellow. This information is not intended to replace advice given to you by your health care provider. Make sure you discuss any questions you have with your health care provider. Document Revised: 08/22/2018 Document Reviewed: 03/14/2018 Elsevier Patient Education  2020 [employer] Inc[employer] Vaginal Yeast Infection, Adult  Vaginal yeast infection is a condition that causes vaginal discharge as well as soreness,  swelling, and redness (inflammation) of the vagina. This is a common condition. Some women get this infection frequently. What are the causes? This condition is caused by a change in the normal balance of the yeast (candida) and bacteria that live in the vagina. This change causes an overgrowth of yeast, which causes the inflammation. What increases the risk? The condition is more likely to develop in women who:  Take antibiotic medicines.  Have diabetes.  Take birth control pills.  Are pregnant.  Douche often.  Have a weak body defense system (immune system).  Have been taking steroid medicines for a long time.  Frequently wear tight clothing. What are the signs or symptoms? Symptoms of this condition include:  White, thick, creamy vaginal discharge.  Swelling, itching, redness, and irritation of the vagina. The lips of the vagina (vulva) may be affected as well.  Pain or a burning feeling while urinating.  Pain during sex. How is this diagnosed? This condition is diagnosed based on:  Your medical history.  A physical exam.  A pelvic exam. Your health care provider will examine a sample of your vaginal discharge under a microscope. Your health care provider may send this sample for testing to confirm the diagnosis. How is this treated? This condition is treated with medicine. Medicines may be over-the-counter or prescription. You may be told to use one or more of the following:  Medicine that is taken by mouth (orally).  Medicine that is applied as a cream (topically).  Medicine that is inserted directly into the vagina (suppository). Follow these instructions at home:  Lifestyle  Do not have sex until your health care provider approves. Tell your sex partner that you have a yeast infection. That person should go to his or her health care provider and ask if they should also be treated.  Do not wear tight clothes, such as pantyhose or tight pants.  Wear  breathable cotton underwear. General instructions  Take or apply over-the-counter and prescription medicines only as told by your health care provider.  Eat more yogurt. This may help to keep your yeast infection from returning.  Do not use tampons until your health care provider approves.  Try taking a sitz bath to help with discomfort. This is a warm water bath that is taken while you are sitting down. The water should only come up to your hips and should cover your buttocks. Do this 3-4 times per day or as told by your health care provider.  Do not douche.  If you have diabetes, keep your blood sugar levels under control.  Keep all follow-up visits as told by your health care provider. This is important. Contact a health care provider if:  You have a fever.  Your symptoms go away and then return.  Your symptoms do not get better with treatment.  Your symptoms get worse.  You have new symptoms.  You develop blisters in or around your vagina.  You  have blood coming from your vagina and it is not your menstrual period.  You develop pain in your abdomen. Summary  Vaginal yeast infection is a condition that causes discharge as well as soreness, swelling, and redness (inflammation) of the vagina.  This condition is treated with medicine. Medicines may be over-the-counter or prescription.  Take or apply over-the-counter and prescription medicines only as told by your health care provider.  Do not douche. Do not have sex or use tampons until your health care provider approves.  Contact a health care provider if your symptoms do not get better with treatment or your symptoms go away and then return. This information is not intended to replace advice given to you by your health care provider. Make sure you discuss any questions you have with your health care provider. Document Revised: 04/04/2019 Document Reviewed: 01/21/2018 Elsevier Patient Education  2020 [employer] Inc.  

## 2020-03-23 NOTE — Progress Notes (Signed)
   Acute Office Visit  Subjective:    Patient ID: Madison Combs, female    DOB: [birth year], 39 y.o.   MRN: [id number]  Chief Complaint  Patient presents with  . Dysuria  . Urinary Frequency    HPI 39 year old presents today for dysuria and frequency that began 2 days ago. She has a history of UTIs, usually gets 2-3 per year. She had intercourse with new partner 3 days ago where condom fell off so she is requesting STD testing today. Denies vaginal symptoms.    Review of Systems  Constitutional: Negative.   Gastrointestinal: Negative.   Genitourinary: Positive for dysuria and frequency. Negative for hematuria, urgency and vaginal discharge.       Objective:    Physical Exam Constitutional:      Appearance: Normal appearance.  Abdominal:     General: Abdomen is flat.     Palpations: Abdomen is soft.     Tenderness: There is no right CVA tenderness or left CVA tenderness.  Genitourinary:    General: Normal vulva.     Vagina: Vaginal discharge (white, thick) present. No erythema or tenderness.     Cervix: Normal.     BP 122/80 (BP Location: Right Arm, Patient Position: Sitting, Cuff Size: Normal)   LMP 02/19/2020  Wt Readings from Last 3 Encounters:  02/09/20 182 lb (82.6 kg)  02/03/19 193 lb (87.5 kg)  01/28/18 186 lb 3.2 oz (84.5 kg)   UA: Trace leukocytes, WBC 10-20, many bacteria, moderate mucus Wet prep + yeast     Assessment & Plan:   Problem List Items Addressed This Visit    None    Visit Diagnoses    Urinary frequency    -  Primary   Relevant Orders   Urinalysis,Complete w/RFL Culture   Vaginal discharge       Relevant Orders   WET PREP FOR TRICH, YEAST, CLUE   Screen for STD (sexually transmitted disease)       Relevant Orders   C. trachomatis/N. gonorrhoeae RNA   HIV Antibody (routine testing w rflx)   RPR   Acute cystitis without hematuria       Relevant Medications   sulfamethoxazole-trimethoprim (BACTRIM DS) 800-160 MG tablet    Unprotected sexual intercourse       Relevant Orders   C. trachomatis/N. gonorrhoeae RNA   HIV Antibody (routine testing w rflx)   RPR   Vaginal candidiasis       Relevant Medications   sulfamethoxazole-trimethoprim (BACTRIM DS) 800-160 MG tablet   fluconazole (DIFLUCAN) 150 MG tablet     Plan: Diflucan 150 mg x 1 dose for vaginal yeast.  Bactrim 800-160 mg twice a day for 5 days for UTI.  Instructed to complete full course of antibiotics even if symptoms improve.  Chlamydia/gonorrhea, HIV, RPR. Will follow up as needed.      Olivia Mackie [facility], 12:32 PM 03/23/2020

## 2020-03-24 LAB — C. TRACHOMATIS/N. GONORRHOEAE RNA
C. trachomatis RNA, TMA: NOT DETECTED
N. gonorrhoeae RNA, TMA: NOT DETECTED

## 2020-03-24 LAB — HIV ANTIBODY (ROUTINE TESTING W REFLEX): HIV 1&2 Ab, 4th Generation: NONREACTIVE

## 2020-03-24 LAB — WET PREP FOR TRICH, YEAST, CLUE

## 2020-03-24 LAB — RPR: RPR Ser Ql: NONREACTIVE

## 2020-03-26 LAB — URINE CULTURE
MICRO NUMBER:: 10675242
Result:: NO GROWTH
SPECIMEN QUALITY:: ADEQUATE

## 2020-03-26 LAB — URINALYSIS, COMPLETE W/RFL CULTURE
Bilirubin Urine: NEGATIVE
Glucose, UA: NEGATIVE
Hgb urine dipstick: NEGATIVE
Hyaline Cast: NONE SEEN /LPF
Nitrites, Initial: NEGATIVE
RBC / HPF: NONE SEEN /HPF (ref 0–2)
Specific Gravity, Urine: 1.033 (ref 1.001–1.03)
pH: 5.5 (ref 5.0–8.0)

## 2020-03-26 LAB — CULTURE INDICATED

## 2020-05-07 ENCOUNTER — Other Ambulatory Visit: Payer: BC Managed Care – PPO

## 2020-05-07 ENCOUNTER — Other Ambulatory Visit: Payer: Self-pay | Admitting: Sleep Medicine

## 2020-05-07 DIAGNOSIS — Z20822 Contact with and (suspected) exposure to covid-19: Secondary | ICD-10-CM

## 2020-05-08 LAB — NOVEL CORONAVIRUS, NAA: SARS-CoV-2, NAA: NOT DETECTED

## 2020-05-08 LAB — SARS-COV-2, NAA 2 DAY TAT

## 2020-05-19 ENCOUNTER — Ambulatory Visit: Payer: BC Managed Care – PPO | Admitting: Nurse Practitioner

## 2020-05-19 ENCOUNTER — Encounter: Payer: Self-pay | Admitting: Nurse Practitioner

## 2020-05-19 ENCOUNTER — Other Ambulatory Visit: Payer: Self-pay

## 2020-05-19 VITALS — BP 110/78

## 2020-05-19 DIAGNOSIS — N898 Other specified noninflammatory disorders of vagina: Secondary | ICD-10-CM | POA: Diagnosis not present

## 2020-05-19 DIAGNOSIS — B373 Candidiasis of vulva and vagina: Secondary | ICD-10-CM

## 2020-05-19 DIAGNOSIS — R350 Frequency of micturition: Secondary | ICD-10-CM

## 2020-05-19 DIAGNOSIS — B3731 Acute candidiasis of vulva and vagina: Secondary | ICD-10-CM

## 2020-05-19 LAB — WET PREP FOR TRICH, YEAST, CLUE

## 2020-05-19 MED ORDER — FLUCONAZOLE 150 MG PO TABS: 150.0000 mg | ORAL_TABLET | ORAL | 0 refills | Status: DC

## 2020-05-19 NOTE — Progress Notes (Signed)
   Acute Office Visit  Subjective:    Patient ID: Madison Combs, female    DOB: Nov 21, 1980, 39 y.o.   MRN: [id number]   HPI 39 y.o. presents today for dysuria, frequency, and orange urine. She was seen in April and July for urinary symptoms with negative urine cultures. 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 Denies vaginal symptoms.    Review of Systems  Constitutional: Negative.   Genitourinary: Positive for dysuria and frequency. Negative for flank pain, hematuria, urgency, vaginal discharge and vaginal pain.       Objective:    Physical Exam Constitutional:      Appearance: Normal appearance.  Genitourinary:    General: Normal vulva.     Vagina: Vaginal discharge (thick, white) present. No erythema.     BP 110/78 (BP Location: Right Arm, Patient Position: Sitting, Cuff Size: Normal)   LMP 04/25/2020  Wt Readings from Last 3 Encounters:  02/09/20 182 lb (82.6 kg)  02/03/19 193 lb (87.5 kg)  01/28/18 186 lb 3.2 oz (84.5 kg)       Assessment & Plan:   Problem List Items Addressed This Visit    None    Visit Diagnoses    Urinary frequency    -  Primary   Relevant Orders   Urinalysis,Complete w/RFL Culture   Vaginal discharge       Relevant Orders   WET PREP FOR TRICH, YEAST, CLUE   Vulvovaginal candidiasis       Relevant Medications   fluconazole (DIFLUCAN) 150 MG tablet     Plan: Wet prep and exam consistent with yeast.  Diflucan 150 mg today and repeat in 3 days.  UA unremarkable.  This is the third time being seen for dysuria and frequency with negative urine cultures.  Discussed importance of behavioral changes to include increasing water intake, using the bathroom more often and making sure to fully empty the bladder, and avoid bladder irritants such as caffeine, chocolate, tea.  If symptoms worsen or do not improve we will consider a urology referral.  She is agreeable to plan.     Olivia Mackie [facility] 1:07 PM 05/19/2020

## 2020-05-21 LAB — URINALYSIS, COMPLETE W/RFL CULTURE
Bilirubin Urine: NEGATIVE
Glucose, UA: NEGATIVE
Hgb urine dipstick: NEGATIVE
Hyaline Cast: NONE SEEN /LPF
Nitrites, Initial: NEGATIVE
Protein, ur: NEGATIVE
RBC / HPF: NONE SEEN /HPF (ref 0–2)
Specific Gravity, Urine: 1.025 (ref 1.001–1.03)
pH: 5.5 (ref 5.0–8.0)

## 2020-05-21 LAB — URINE CULTURE
MICRO NUMBER:: 10899189
SPECIMEN QUALITY:: ADEQUATE

## 2020-05-21 LAB — CULTURE INDICATED

## 2020-06-02 ENCOUNTER — Other Ambulatory Visit: Payer: Self-pay

## 2020-06-02 ENCOUNTER — Other Ambulatory Visit: Payer: BC Managed Care – PPO

## 2020-06-02 DIAGNOSIS — Z20822 Contact with and (suspected) exposure to covid-19: Secondary | ICD-10-CM

## 2020-06-04 LAB — SARS-COV-2, NAA 2 DAY TAT

## 2020-06-04 LAB — NOVEL CORONAVIRUS, NAA: SARS-CoV-2, NAA: NOT DETECTED

## 2020-06-08 ENCOUNTER — Other Ambulatory Visit: Payer: Self-pay

## 2020-06-08 ENCOUNTER — Encounter: Payer: Self-pay | Admitting: Nurse Practitioner

## 2020-06-08 ENCOUNTER — Ambulatory Visit: Payer: BC Managed Care – PPO | Admitting: Nurse Practitioner

## 2020-06-08 VITALS — BP 115/80

## 2020-06-08 DIAGNOSIS — Z8541 Personal history of malignant neoplasm of cervix uteri: Secondary | ICD-10-CM | POA: Diagnosis not present

## 2020-06-08 DIAGNOSIS — N898 Other specified noninflammatory disorders of vagina: Secondary | ICD-10-CM

## 2020-06-08 DIAGNOSIS — Z13220 Encounter for screening for lipoid disorders: Secondary | ICD-10-CM

## 2020-06-08 DIAGNOSIS — Z01419 Encounter for gynecological examination (general) (routine) without abnormal findings: Secondary | ICD-10-CM

## 2020-06-08 DIAGNOSIS — D069 Carcinoma in situ of cervix, unspecified: Secondary | ICD-10-CM

## 2020-06-08 LAB — WET PREP FOR TRICH, YEAST, CLUE

## 2020-06-08 NOTE — Progress Notes (Signed)
History: 39 year old G2 P1 presents for repeat Pap and annual lab work.  May 2018 Pap with negative cytology positive HPV, did not follow up for recommended colposcopy.  May 2020 ASCUS with positive HPV, 05/14/2019 LEEP CIN-3 with negative margins, positive HPV 16.  Most recent Pap in March 2021 was normal.  Complains of vaginal discharge today and would like to check for yeast.  Exam: Appears well.  External genitalia normal, vagina normal with small amount of white discharge, no erythema.  Cervix normal, scarring present.  Assessment: High grade squamous intraepithelial lesion (HGSIL), grade 3 CIN, on biopsy of cervix - Plan: PAP,TP IMGw/HPV RNA,rflx [id number],61/95 Vaginal discharge - Plan: WET PREP FOR TRICH, YEAST, CLUE  Plan: Repeat Pap today.  If normal we will return to annual screenings.  Wet prep unremarkable.  CBC, CMP, lipid panel today per request as she did not want these done at annual visit in May.  Will call with results.  She is agreeable to plan.

## 2020-06-10 LAB — PAP, TP IMAGING W/ HPV RNA, RFLX HPV TYPE 16,18/45: HPV DNA High Risk: NOT DETECTED

## 2020-09-09 ENCOUNTER — Other Ambulatory Visit: Payer: BC Managed Care – PPO

## 2020-09-09 DIAGNOSIS — Z20822 Contact with and (suspected) exposure to covid-19: Secondary | ICD-10-CM

## 2020-09-11 LAB — NOVEL CORONAVIRUS, NAA: SARS-CoV-2, NAA: NOT DETECTED

## 2020-09-11 LAB — SARS-COV-2, NAA 2 DAY TAT

## 2020-09-13 ENCOUNTER — Other Ambulatory Visit: Payer: BC Managed Care – PPO

## 2020-09-13 DIAGNOSIS — Z20822 Contact with and (suspected) exposure to covid-19: Secondary | ICD-10-CM

## 2020-09-14 ENCOUNTER — Other Ambulatory Visit: Payer: BC Managed Care – PPO

## 2020-09-14 LAB — SARS-COV-2, NAA 2 DAY TAT

## 2020-09-14 LAB — NOVEL CORONAVIRUS, NAA: SARS-CoV-2, NAA: NOT DETECTED

## 2020-10-25 ENCOUNTER — Other Ambulatory Visit: Payer: Self-pay | Admitting: Nurse Practitioner

## 2020-10-25 DIAGNOSIS — Z3045 Encounter for surveillance of transdermal patch hormonal contraceptive device: Secondary | ICD-10-CM

## 2020-10-25 NOTE — Telephone Encounter (Signed)
Medication refill request: [facility] Last AEX:  02-09-20 TW Next AEX: not scheduled Last MMG (if hormonal medication request): n/a Refill authorized: Today, please advise.  Medication pended for #3, 0RF. Please refill if appropriate.

## 2020-11-17 ENCOUNTER — Other Ambulatory Visit: Payer: Self-pay | Admitting: Nurse Practitioner

## 2020-11-17 DIAGNOSIS — Z3045 Encounter for surveillance of transdermal patch hormonal contraceptive device: Secondary | ICD-10-CM

## 2020-12-14 ENCOUNTER — Other Ambulatory Visit: Payer: Self-pay | Admitting: Nurse Practitioner

## 2020-12-14 DIAGNOSIS — Z3045 Encounter for surveillance of transdermal patch hormonal contraceptive device: Secondary | ICD-10-CM

## 2020-12-14 NOTE — Telephone Encounter (Signed)
annual exam due in May

## 2020-12-21 ENCOUNTER — Other Ambulatory Visit: Payer: Self-pay | Admitting: Nurse Practitioner

## 2020-12-21 DIAGNOSIS — Z1231 Encounter for screening mammogram for malignant neoplasm of breast: Secondary | ICD-10-CM

## 2021-01-21 ENCOUNTER — Other Ambulatory Visit: Payer: Self-pay | Admitting: Nurse Practitioner

## 2021-01-21 DIAGNOSIS — Z3045 Encounter for surveillance of transdermal patch hormonal contraceptive device: Secondary | ICD-10-CM

## 2021-02-09 ENCOUNTER — Ambulatory Visit
Admission: RE | Admit: 2021-02-09 | Discharge: 2021-02-09 | Disposition: A | Discharge status: Home / Self Care | Payer: BC Managed Care – PPO | Source: Ambulatory Visit | Attending: Nurse Practitioner | Admitting: Nurse Practitioner

## 2021-02-09 ENCOUNTER — Other Ambulatory Visit: Payer: Self-pay

## 2021-02-09 DIAGNOSIS — Z1231 Encounter for screening mammogram for malignant neoplasm of breast: Secondary | ICD-10-CM

## 2021-02-10 ENCOUNTER — Ambulatory Visit: Payer: BC Managed Care – PPO | Admitting: Nurse Practitioner

## 2021-02-10 ENCOUNTER — Encounter: Payer: Self-pay | Admitting: Nurse Practitioner

## 2021-02-10 ENCOUNTER — Ambulatory Visit (INDEPENDENT_AMBULATORY_CARE_PROVIDER_SITE_OTHER): Payer: BC Managed Care – PPO | Admitting: Nurse Practitioner

## 2021-02-10 ENCOUNTER — Other Ambulatory Visit (HOSPITAL_COMMUNITY)
Admission: RE | Admit: 2021-02-10 | Discharge: 2021-02-10 | Disposition: A | Discharge status: Home / Self Care | Payer: BC Managed Care – PPO | Source: Ambulatory Visit | Attending: Nurse Practitioner | Admitting: Nurse Practitioner

## 2021-02-10 VITALS — BP 120/78 | Ht 65.0 in | Wt 200.0 lb

## 2021-02-10 DIAGNOSIS — Z113 Encounter for screening for infections with a predominantly sexual mode of transmission: Secondary | ICD-10-CM | POA: Diagnosis present

## 2021-02-10 DIAGNOSIS — Z9889 Other specified postprocedural states: Secondary | ICD-10-CM | POA: Insufficient documentation

## 2021-02-10 DIAGNOSIS — Z01419 Encounter for gynecological examination (general) (routine) without abnormal findings: Secondary | ICD-10-CM

## 2021-02-10 DIAGNOSIS — Z3045 Encounter for surveillance of transdermal patch hormonal contraceptive device: Secondary | ICD-10-CM | POA: Diagnosis not present

## 2021-02-10 LAB — CBC WITH DIFFERENTIAL/PLATELET
Absolute Monocytes: 444 cells/uL (ref 200–950)
Basophils Absolute: 44 cells/uL (ref 0–200)
Basophils Relative: 0.5 %
Eosinophils Absolute: 52 cells/uL (ref 15–500)
Eosinophils Relative: 0.6 %
HCT: 38.1 % (ref 35.0–45.0)
Hemoglobin: 12.5 g/dL (ref 11.7–15.5)
Lymphs Abs: 3532 cells/uL (ref 850–3900)
MCH: 29.3 pg (ref 27.0–33.0)
MCHC: 32.8 g/dL (ref 32.0–36.0)
MCV: 89.2 fL (ref 80.0–100.0)
MPV: 9.9 fL (ref 7.5–12.5)
Monocytes Relative: 5.1 %
Neutro Abs: 4628 cells/uL (ref 1500–7800)
Neutrophils Relative %: 53.2 %
Platelets: 402 10*3/uL — ABNORMAL HIGH (ref 140–400)
RBC: 4.27 10*6/uL (ref 3.80–5.10)
RDW: 13.1 % (ref 11.0–15.0)
Total Lymphocyte: 40.6 %
WBC: 8.7 10*3/uL (ref 3.8–10.8)

## 2021-02-10 LAB — LIPID PANEL
Cholesterol: 245 mg/dL — ABNORMAL HIGH (ref <200)
HDL: 71 mg/dL (ref >=50)
LDL Cholesterol (Calc): 151 mg/dL (calc) — ABNORMAL HIGH
Non-HDL Cholesterol (Calc): 174 mg/dL (calc) — ABNORMAL HIGH (ref <130)
Total CHOL/HDL Ratio: 3.5 (calc) (ref <5.0)
Triglycerides: 115 mg/dL (ref <150)

## 2021-02-10 LAB — COMPREHENSIVE METABOLIC PANEL
AG Ratio: 1.4 (calc) (ref 1.0–2.5)
ALT: 12 U/L (ref 6–29)
AST: 17 U/L (ref 10–30)
Albumin: 3.9 g/dL (ref 3.6–5.1)
Alkaline phosphatase (APISO): 96 U/L (ref 31–125)
BUN: 9 mg/dL (ref 7–25)
CO2: 26 mmol/L (ref 20–32)
Calcium: 9.5 mg/dL (ref 8.6–10.2)
Chloride: 103 mmol/L (ref 98–110)
Creat: 0.69 mg/dL (ref 0.50–1.10)
Globulin: 2.8 g/dL (calc) (ref 1.9–3.7)
Glucose, Bld: 91 mg/dL (ref 65–99)
Potassium: 4 mmol/L (ref 3.5–5.3)
Sodium: 137 mmol/L (ref 135–146)
Total Bilirubin: 0.4 mg/dL (ref 0.2–1.2)
Total Protein: 6.7 g/dL (ref 6.1–8.1)

## 2021-02-10 MED ORDER — XULANE 150-35 MCG/24HR TD PTWK
1.0000 | MEDICATED_PATCH | TRANSDERMAL | 4 refills | Status: DC
Start: 2021-02-10 — End: 2022-02-15

## 2021-02-10 NOTE — Patient Instructions (Signed)
Health Maintenance, Female Adopting a healthy lifestyle and getting preventive care are important in promoting health and wellness. Ask your health care provider about:  The right schedule for you to have regular tests and exams.  Things you can do on your own to prevent diseases and keep yourself healthy. What should I know about diet, weight, and exercise? Eat a healthy diet  Eat a diet that includes plenty of vegetables, fruits, low-fat dairy products, and lean protein.  Do not eat a lot of foods that are high in solid fats, added sugars, or sodium.   Maintain a healthy weight Body mass index (BMI) is used to identify weight problems. It estimates body fat based on height and weight. Your health care provider can help determine your BMI and help you achieve or maintain a healthy weight. Get regular exercise Get regular exercise. This is one of the most important things you can do for your health. Most adults should:  Exercise for at least 150 minutes each week. The exercise should increase your heart rate and make you sweat (moderate-intensity exercise).  Do strengthening exercises at least twice a week. This is in addition to the moderate-intensity exercise.  Spend less time sitting. Even light physical activity can be beneficial. Watch cholesterol and blood lipids Have your blood tested for lipids and cholesterol at 40 years of age, then have this test every 5 years. Have your cholesterol levels checked more often if:  Your lipid or cholesterol levels are high.  You are older than 40 years of age.  You are at high risk for heart disease. What should I know about cancer screening? Depending on your health history and family history, you may need to have cancer screening at various ages. This may include screening for:  Breast cancer.  Cervical cancer.  Colorectal cancer.  Skin cancer.  Lung cancer. What should I know about heart disease, diabetes, and high blood  pressure? Blood pressure and heart disease  High blood pressure causes heart disease and increases the risk of stroke. This is more likely to develop in people who have high blood pressure readings, are of African descent, or are overweight.  Have your blood pressure checked: ? Every 3-5 years if you are 18-39 years of age. ? Every year if you are 40 years old or older. Diabetes Have regular diabetes screenings. This checks your fasting blood sugar level. Have the screening done:  Once every three years after age 40 if you are at a normal weight and have a low risk for diabetes.  More often and at a younger age if you are overweight or have a high risk for diabetes. What should I know about preventing infection? Hepatitis B If you have a higher risk for hepatitis B, you should be screened for this virus. Talk with your health care provider to find out if you are at risk for hepatitis B infection. Hepatitis C Testing is recommended for:  Everyone born from 1945 through 1965.  Anyone with known risk factors for hepatitis C. Sexually transmitted infections (STIs)  Get screened for STIs, including gonorrhea and chlamydia, if: ? You are sexually active and are younger than 40 years of age. ? You are older than 40 years of age and your health care provider tells you that you are at risk for this type of infection. ? Your sexual activity has changed since you were last screened, and you are at increased risk for chlamydia or gonorrhea. Ask your health care provider   if you are at risk.  Ask your health care provider about whether you are at high risk for HIV. Your health care provider may recommend a prescription medicine to help prevent HIV infection. If you choose to take medicine to prevent HIV, you should first get tested for HIV. You should then be tested every 3 months for as long as you are taking the medicine. Pregnancy  If you are about to stop having your period (premenopausal) and  you may become pregnant, seek counseling before you get pregnant.  Take 400 to 800 micrograms (mcg) of folic acid every day if you become pregnant.  Ask for birth control (contraception) if you want to prevent pregnancy. Osteoporosis and menopause Osteoporosis is a disease in which the bones lose minerals and strength with aging. This can result in bone fractures. If you are 65 years old or older, or if you are at risk for osteoporosis and fractures, ask your health care provider if you should:  Be screened for bone loss.  Take a calcium or vitamin D supplement to lower your risk of fractures.  Be given hormone replacement therapy (HRT) to treat symptoms of menopause. Follow these instructions at home: Lifestyle  Do not use any products that contain nicotine or tobacco, such as cigarettes, e-cigarettes, and chewing tobacco. If you need help quitting, ask your health care provider.  Do not use street drugs.  Do not share needles.  Ask your health care provider for help if you need support or information about quitting drugs. Alcohol use  Do not drink alcohol if: ? Your health care provider tells you not to drink. ? You are pregnant, may be pregnant, or are planning to become pregnant.  If you drink alcohol: ? Limit how much you use to 0-1 drink a day. ? Limit intake if you are breastfeeding.  Be aware of how much alcohol is in your drink. In the [place] one drink equals one 12 oz bottle of beer (355 mL), one 5 oz glass of wine (148 mL), or one 1 oz glass of hard liquor (44 mL). General instructions  Schedule regular health, dental, and eye exams.  Stay current with your vaccines.  Tell your health care provider if: ? You often feel depressed. ? You have ever been abused or do not feel safe at home. Summary  Adopting a healthy lifestyle and getting preventive care are important in promoting health and wellness.  Follow your health care provider's instructions about healthy  diet, exercising, and getting tested or screened for diseases.  Follow your health care provider's instructions on monitoring your cholesterol and blood pressure. This information is not intended to replace advice given to you by your health care provider. Make sure you discuss any questions you have with your health care provider. Document Revised: 08/28/2018 Document Reviewed: 08/28/2018 Elsevier Patient Education  2021 Elsevier Inc.  

## 2021-02-10 NOTE — Progress Notes (Signed)
   Madison Combs 1981/05/25 [id number]   History:  40 y.o. G2P0011 presents for annual exam without GYN complaints. Monthly cycle/Xulane patches. 2018 normal cytology positive HPV, 2020 ASCUS positive HPV with LEEP confirming CIN-3 with negative margins positive 16, normal pap 11/2019 and 05/2020. Normal mammogram history. New partner.   Gynecologic History Patient's last menstrual period was 02/02/2021. Period Cycle (Days): 28 Period Duration (Days): 5 Period Pattern: Regular Menstrual Flow: Moderate Dysmenorrhea: (!) Mild Dysmenorrhea Symptoms: Cramping Contraception/Family planning: Ortho-Evra patches weekly  Health Maintenance Last Pap: 06/08/2020. Results were: normal Last mammogram: 02/09/2021. Results were: no report yet Last colonoscopy: Not indicated Last Dexa: Not indicated  Past medical history, past surgical history, family history and social history were all reviewed and documented in the EPIC chart. Widowed. 33 yo daughter. 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   ROS:  A ROS was performed and pertinent positives and negatives are included.  Exam:  Vitals:   02/10/21 1011  BP: 120/78  Weight: 200 lb (90.7 kg)  Height: 5\' 5"  (1.651 m)   Body mass index is 33.28 kg/m.  General appearance:  Normal Thyroid:  Symmetrical, normal in size, without palpable masses or nodularity. Respiratory  Auscultation:  Clear without wheezing or rhonchi Cardiovascular  Auscultation:  Regular rate, without rubs, murmurs or gallops  Edema/varicosities:  Not grossly evident Abdominal  Soft,nontender, without masses, guarding or rebound.  Liver/spleen:  No organomegaly noted  Hernia:  None appreciated  Skin  Inspection:  Grossly normal Breasts: Examined lying and sitting.   Right: Without masses, retractions, nipple discharge or axillary adenopathy.   Left: Without masses, retractions, nipple discharge or axillary adenopathy. Genitourinary   Inguinal/mons:  Normal  without inguinal adenopathy  External genitalia:  Normal appearing vulva with no masses, tenderness, or lesions  BUS/Urethra/Skene's glands:  Normal  Vagina:  Normal appearing with normal color and discharge, no lesions  Cervix:  Normal appearing without discharge or lesions  Uterus:  Normal in size, shape and contour.  Midline and mobile, nontender  Adnexa/parametria:     Rt: Normal in size, without masses or tenderness.   Lt: Normal in size, without masses or tenderness.  Anus and perineum: Normal  Digital rectal exam: Declines  Assessment/Plan:  40 y.o. G2P0011 for annual exam.   Well female exam with routine gynecological exam - Plan: CBC with Differential/Platelet, Comprehensive metabolic panel, Lipid panel, Cytology - PAP( [place]). Education provided on SBEs, importance of preventative screenings, current guidelines, high calcium diet, regular exercise, and multivitamin daily.   History of loop electrical excision procedure (LEEP) - Plan: Cytology - PAP( [place]). 2020 LEEP CIN-3 negative margins, + 16.   Encounter for surveillance of transdermal patch hormonal contraceptive device - Plan: norelgestromin-ethinyl estradiol 41) 150-35 MCG/24HR transdermal patch weekly. Using as prescribed. Refill x 1 year provided.   Screen for STD (sexually transmitted disease) - Plan: Cytology - PAP( [place]). GC/chlamydia added to pap.   Screening for cervical cancer - 2018 normal cytology positive HPV, 2020 ASCUS positive HPV with LEEP confirming CIN-3 with negative margins positive 16, normal pap 11/2019 and 05/2020. Pap with HR HPV today and if normal will return to annual screening for 2 more years, then every 3 per guidelines.   Screening for breast cancer - Screening mammogram yesterday, no report yet. Normal breast exam today.  Return in 1 year for annual.    06/2020 DNP, 10:30 AM 02/10/2021

## 2021-02-15 LAB — CYTOLOGY - PAP
Chlamydia: NEGATIVE
Comment: NEGATIVE
Comment: NEGATIVE
Comment: NORMAL
Diagnosis: NEGATIVE
High risk HPV: NEGATIVE
Neisseria Gonorrhea: NEGATIVE

## 2021-03-10 ENCOUNTER — Ambulatory Visit: Payer: BC Managed Care – PPO | Admitting: Nurse Practitioner

## 2021-03-10 ENCOUNTER — Other Ambulatory Visit: Payer: Self-pay

## 2021-03-10 VITALS — BP 110/82 | HR 88 | Ht 65.0 in | Wt 200.0 lb

## 2021-03-10 DIAGNOSIS — B373 Candidiasis of vulva and vagina: Secondary | ICD-10-CM | POA: Diagnosis not present

## 2021-03-10 DIAGNOSIS — B3731 Acute candidiasis of vulva and vagina: Secondary | ICD-10-CM

## 2021-03-10 DIAGNOSIS — R300 Dysuria: Secondary | ICD-10-CM

## 2021-03-10 LAB — WET PREP FOR TRICH, YEAST, CLUE

## 2021-03-10 MED ORDER — FLUCONAZOLE 150 MG PO TABS: 150.0000 mg | ORAL_TABLET | ORAL | 0 refills | Status: DC

## 2021-03-10 NOTE — Progress Notes (Signed)
   Acute Office Visit  Subjective:    Patient ID: Madison Combs, female    DOB: 12-16-1980, 40 y.o.   MRN: [id number]   HPI 40 y.o. presents today for vaginal burning with urination that started a couple of days ago. Denies frequency, urgency, hematuria, vaginal discharge or odor. She does feel mild vulvar irritation.    Review of Systems  Constitutional: Negative.   Genitourinary:  Positive for dysuria. Negative for flank pain, frequency, hematuria, urgency and vaginal discharge.       Vulvar irritation      Objective:    Physical Exam Constitutional:      Appearance: Normal appearance.  Genitourinary:    General: Normal vulva.     Vagina: Normal.     Cervix: Normal.    BP 110/82   Pulse 88   Ht 5\' 5"  (1.651 m)   Wt 200 lb (90.7 kg)   SpO2 100%   BMI 33.28 kg/m  Wt Readings from Last 3 Encounters:  03/10/21 200 lb (90.7 kg)  02/10/21 200 lb (90.7 kg)  02/09/20 182 lb (82.6 kg)   Wet prep + yeast UA negative     Assessment & Plan:   Problem List Items Addressed This Visit   None Visit Diagnoses     Vulvovaginal candidiasis    -  Primary   Relevant Medications   fluconazole (DIFLUCAN) 150 MG tablet   Burning with urination       Relevant Orders   Urinalysis,Complete w/RFL Culture   WET PREP FOR TRICH, YEAST, CLUE      Plan: Wet prep positive for yeast - Diflucan 150 mg tablet today and repeat in 3 days if symptoms persist for total of 2 doses. UA negative. She will return if symptoms worsen or do not improve. She is agreeable to plan.      02/11/20 DNP, 12:54 PM 03/10/2021

## 2021-03-12 LAB — URINE CULTURE
MICRO NUMBER:: 12042441
Result:: NO GROWTH
SPECIMEN QUALITY:: ADEQUATE

## 2021-03-12 LAB — URINALYSIS, COMPLETE W/RFL CULTURE
Bilirubin Urine: NEGATIVE
Glucose, UA: NEGATIVE
Hgb urine dipstick: NEGATIVE
Hyaline Cast: NONE SEEN /LPF
Ketones, ur: NEGATIVE
Leukocyte Esterase: NEGATIVE
Nitrites, Initial: NEGATIVE
Protein, ur: NEGATIVE
Specific Gravity, Urine: 1.02 (ref 1.001–1.035)
pH: 6 (ref 5.0–8.0)

## 2021-03-12 LAB — CULTURE INDICATED

## 2021-03-29 ENCOUNTER — Ambulatory Visit: Payer: BC Managed Care – PPO | Attending: Critical Care Medicine

## 2021-03-29 DIAGNOSIS — Z20822 Contact with and (suspected) exposure to covid-19: Secondary | ICD-10-CM

## 2021-03-30 LAB — SARS-COV-2, NAA 2 DAY TAT

## 2021-03-30 LAB — NOVEL CORONAVIRUS, NAA: SARS-CoV-2, NAA: NOT DETECTED

## 2021-05-02 ENCOUNTER — Other Ambulatory Visit: Payer: Self-pay

## 2021-05-02 ENCOUNTER — Ambulatory Visit: Payer: BC Managed Care – PPO | Admitting: Nurse Practitioner

## 2021-05-02 ENCOUNTER — Encounter: Payer: Self-pay | Admitting: Nurse Practitioner

## 2021-05-02 VITALS — BP 118/78

## 2021-05-02 DIAGNOSIS — R300 Dysuria: Secondary | ICD-10-CM | POA: Diagnosis not present

## 2021-05-02 DIAGNOSIS — B373 Candidiasis of vulva and vagina: Secondary | ICD-10-CM

## 2021-05-02 DIAGNOSIS — N898 Other specified noninflammatory disorders of vagina: Secondary | ICD-10-CM

## 2021-05-02 DIAGNOSIS — B3731 Acute candidiasis of vulva and vagina: Secondary | ICD-10-CM

## 2021-05-02 LAB — WET PREP FOR TRICH, YEAST, CLUE

## 2021-05-02 MED ORDER — FLUCONAZOLE 150 MG PO TABS: 150.0000 mg | ORAL_TABLET | ORAL | 0 refills | Status: DC

## 2021-05-02 NOTE — Progress Notes (Signed)
   Acute Office Visit  Subjective:    Patient ID: Madison Combs, female    DOB: [birth year], 40 y.o.   MRN: [id number]   HPI 40 y.o. presents today for vaginal discharge and mild dysuria and frequency. She has started back to work and always feels like she gets UTIs due to not urinating often. Denies itching or odor.   Review of Systems  Constitutional: Negative.   Genitourinary:  Positive for dysuria, frequency and vaginal discharge. Negative for flank pain, hematuria, urgency and vaginal pain.      Objective:    Physical Exam Constitutional:      Appearance: Normal appearance.  Genitourinary:    General: Normal vulva.     Vagina: Normal.     Cervix: Normal.    BP 118/78   LMP 04/27/2021  Wt Readings from Last 3 Encounters:  03/10/21 200 lb (90.7 kg)  02/10/21 200 lb (90.7 kg)  02/09/20 182 lb (82.6 kg)   Wet prep + yeast UA negative leukocytes, negative nitrate, negative blood, SG 1.031, orange/cloudy. Microscopic: wbc 0-5, rbc 0-2, few yeast, moderate bacteria    Assessment & Plan:   Problem List Items Addressed This Visit   None Visit Diagnoses     Vulvovaginal candidiasis    -  Primary   Relevant Medications   fluconazole (DIFLUCAN) 150 MG tablet   Dysuria       Relevant Orders   Urinalysis,Complete w/RFL Culture   Vaginal discharge       Relevant Orders   WET PREP FOR TRICH, YEAST, CLUE      Plan: Wet prep positive for yeast - Diflucan 150 mg today and repeat in 3 days for total of 2 doses. UA unremarkable. She will return if symptoms worsen or do not improve.      Olivia Mackie DNP, 3:17 PM 05/02/2021

## 2021-05-04 LAB — URINE CULTURE
MICRO NUMBER:: 12242914
Result:: NO GROWTH
SPECIMEN QUALITY:: ADEQUATE

## 2021-05-04 LAB — URINALYSIS, COMPLETE W/RFL CULTURE
Glucose, UA: NEGATIVE
Hgb urine dipstick: NEGATIVE
Hyaline Cast: NONE SEEN /LPF
Ketones, ur: NEGATIVE
Leukocyte Esterase: NEGATIVE
Nitrites, Initial: NEGATIVE
Protein, ur: NEGATIVE
Specific Gravity, Urine: 1.031 (ref 1.001–1.035)
pH: 5.5 (ref 5.0–8.0)

## 2021-05-04 LAB — CULTURE INDICATED

## 2021-06-08 ENCOUNTER — Encounter: Payer: Self-pay | Admitting: Obstetrics & Gynecology

## 2021-06-08 ENCOUNTER — Ambulatory Visit: Payer: BC Managed Care – PPO | Admitting: Obstetrics & Gynecology

## 2021-06-08 ENCOUNTER — Other Ambulatory Visit: Payer: Self-pay

## 2021-06-08 VITALS — BP 138/84 | HR 112 | Resp 20

## 2021-06-08 DIAGNOSIS — L292 Pruritus vulvae: Secondary | ICD-10-CM | POA: Diagnosis not present

## 2021-06-08 DIAGNOSIS — Z202 Contact with and (suspected) exposure to infections with a predominantly sexual mode of transmission: Secondary | ICD-10-CM | POA: Diagnosis not present

## 2021-06-08 DIAGNOSIS — Z113 Encounter for screening for infections with a predominantly sexual mode of transmission: Secondary | ICD-10-CM | POA: Diagnosis not present

## 2021-06-08 LAB — WET PREP FOR TRICH, YEAST, CLUE

## 2021-06-08 MED ORDER — NYSTATIN-TRIAMCINOLONE 100000-0.1 UNIT/GM-% EX OINT: 1.0000 "application " | TOPICAL_OINTMENT | Freq: Every day | CUTANEOUS | 1 refills | Status: DC

## 2021-06-08 NOTE — Progress Notes (Signed)
    Madison Combs 20-Jan-1981 [id number]        40 y.o.  G2P0011 Boyfriend  RP: Exposure to genital herpes and vulvar itching  HPI: Had intercourse with boyfriend about a week ago.  He had no active genital herpes lesion at that time, but does now.  Patient feels vulvar and perianal irritation but no increase in discharge and no visible lesion compatible with herpes.  No pelvic pain.  No urinary tract infection Sxs.  No fever.  History of LEEP for CIN-3 in 2020.  HPV 16 was positive at that time.  Last Pap test May 2022 was negative with negative high-risk HPV.  STI screening in May 2022 was negative.   OB History  Gravida Para Term Preterm AB Living  2 1     1 1   SAB IAB Ectopic Multiple Live Births  0   0        # Outcome Date GA Lbr Len/2nd Weight Sex Delivery Anes PTL Lv  2 AB           1 Para             Past medical history,surgical history, problem list, medications, allergies, family history and social history were all reviewed and documented in the EPIC chart.   Directed ROS with pertinent positives and negatives documented in the history of present illness/assessment and plan.  Exam:  Vitals:   06/08/21 0845  BP: 138/84  Pulse: (!) 112  Resp: 20   General appearance:  Normal  Abdomen: Normal  Gynecologic exam: Vulva: Mild irritation of the perineum towards the perianal area.  No vesicle or ulcer.  Speculum: Cervix and vagina normal.  Normal secretions.  Gonorrhea and chlamydia done on the cervix.  Wet prep done.  Wet prep Negative   Assessment/Plan:  40 y.o. G2P0011   1. Exposure to genital herpes Possible exposure to genital herpes.  No herpes lesion visible in the genital area.  Full STI screen done including HSV for type I and type II antibodies. - HIV antibody (with reflex) - RPR - Hepatitis B Surface AntiGEN - Hepatitis C Antibody - HSV(herpes simplex vrs) 1+2 ab-IgG  2. Vulvar itching Wet prep negative.  Will use Mycolog on the irritated vulvar  and perianal areas.  Usage reviewed and prescription sent to pharmacy. - WET PREP FOR TRICH, YEAST, CLUE  3. Screen for STD (sexually transmitted disease) Condom use recommended. - HIV antibody (with reflex) - RPR - Hepatitis B Surface AntiGEN - Hepatitis C Antibody - HSV(herpes simplex vrs) 1+2 ab-IgG  Other orders - nystatin-triamcinolone ointment (MYCOLOG); Apply 1 application topically daily. Thin application on affected skin of the vulva and perianal area.   41 MD, 9:04 AM 06/08/2021

## 2021-06-08 NOTE — Addendum Note (Signed)
Addended by: Leda Min on: 06/08/2021 09:45 AM   Modules accepted: Orders

## 2021-06-09 LAB — HSV(HERPES SIMPLEX VRS) I + II AB-IGG
HAV 1 IGG,TYPE SPECIFIC AB: <0.9 index
HSV 2 IGG,TYPE SPECIFIC AB: <0.9 index

## 2021-06-09 LAB — C. TRACHOMATIS/N. GONORRHOEAE RNA
C. trachomatis RNA, TMA: NOT DETECTED
N. gonorrhoeae RNA, TMA: NOT DETECTED

## 2021-06-09 LAB — HEPATITIS C ANTIBODY
Hepatitis C Ab: NONREACTIVE
SIGNAL TO CUT-OFF: 0 (ref <1.00)

## 2021-06-09 LAB — HIV ANTIBODY (ROUTINE TESTING W REFLEX): HIV 1&2 Ab, 4th Generation: NONREACTIVE

## 2021-06-09 LAB — HEPATITIS B SURFACE ANTIGEN: Hepatitis B Surface Ag: NONREACTIVE

## 2021-06-09 LAB — RPR: RPR Ser Ql: NONREACTIVE

## 2021-06-10 ENCOUNTER — Encounter: Payer: Self-pay | Admitting: *Deleted

## 2021-06-14 ENCOUNTER — Ambulatory Visit: Payer: BC Managed Care – PPO | Admitting: Nurse Practitioner

## 2022-01-16 ENCOUNTER — Other Ambulatory Visit: Payer: Self-pay | Admitting: Nurse Practitioner

## 2022-01-16 DIAGNOSIS — Z1231 Encounter for screening mammogram for malignant neoplasm of breast: Secondary | ICD-10-CM

## 2022-02-10 ENCOUNTER — Ambulatory Visit
Admission: RE | Admit: 2022-02-10 | Discharge: 2022-02-10 | Disposition: A | Discharge status: Home / Self Care | Payer: BC Managed Care – PPO | Source: Ambulatory Visit | Attending: Nurse Practitioner | Admitting: Nurse Practitioner

## 2022-02-10 DIAGNOSIS — Z1231 Encounter for screening mammogram for malignant neoplasm of breast: Secondary | ICD-10-CM

## 2022-02-14 NOTE — Progress Notes (Unsigned)
   Madison Combs [birth year] [id number]   History:  41 y.o. G2P0011 presents for annual exam without GYN complaints. Monthly cycle/Xulane patches. 2018 normal cytology positive HPV, 2020 ASCUS positive HPV with LEEP CIN-3 with negative margins positive 16, normal paps since. Normal mammogram history.   Gynecologic History Patient's last menstrual period was 02/01/2022 (exact date). Period Cycle (Days): 28 Period Duration (Days): 4 Period Pattern: Regular Menstrual Flow: Moderate, Heavy Dysmenorrhea: (!) Mild Dysmenorrhea Symptoms: Cramping Contraception/Family planning: Ortho-Evra patches weekly Sexually active: No  Health Maintenance Last Pap: 02/10/2021. Results were: Normal, 1-year repeat Last mammogram: 02/10/2022. Results were: Normal Last colonoscopy: Not indicated Last Dexa: Not indicated  Past medical history, past surgical history, family history and social history were all reviewed and documented in the EPIC chart. Widowed. 59 yo daughter. Just started new position with [employer], working hybrid.   ROS:  A ROS was performed and pertinent positives and negatives are included.  Exam:  Vitals:   02/15/22 0817  BP: (!) 144/86  Weight: 197 lb (89.4 kg)  Height: 5\' 5"  (1.651 m)    Body mass index is 32.78 kg/m.  General appearance:  Normal Thyroid:  Symmetrical, normal in size, without palpable masses or nodularity. Respiratory  Auscultation:  Clear without wheezing or rhonchi Cardiovascular  Auscultation:  Regular rate, without rubs, murmurs or gallops  Edema/varicosities:  Not grossly evident Abdominal  Soft,nontender, without masses, guarding or rebound.  Liver/spleen:  No organomegaly noted  Hernia:  None appreciated  Skin  Inspection:  Grossly normal Breasts: Examined lying and sitting.   Right: Without masses, retractions, nipple discharge or axillary adenopathy.   Left: Without masses, retractions, nipple discharge or axillary adenopathy. Genitourinary    Inguinal/mons:  Normal without inguinal adenopathy  External genitalia:  Normal appearing vulva with no masses, tenderness, or lesions  BUS/Urethra/Skene's glands:  Normal  Vagina:  Normal appearing with normal color and discharge, no lesions  Cervix:  Normal appearing without discharge or lesions  Uterus:  Normal in size, shape and contour.  Midline and mobile, nontender  Adnexa/parametria:     Rt: Normal in size, without masses or tenderness.   Lt: Normal in size, without masses or tenderness.  Anus and perineum: Normal  Digital rectal exam: Declines  Patient informed chaperone available to be present for breast and pelvic exam. Patient has requested no chaperone to be present. Patient has been advised what will be completed during breast and pelvic exam.   Assessment/Plan:  41 y.o. G2P0011 for annual exam.   Well female exam with routine gynecological exam - Plan: Cytology - PAP( [place]), CBC with Differential/Platelet, Comprehensive metabolic panel. Education provided on SBEs, importance of preventative screenings, current guidelines, high calcium diet, regular exercise, and multivitamin daily.  High grade squamous intraepithelial lesion (HGSIL), grade 3 CIN, on biopsy of cervix. 2020 LEEP CIN 3 with negative margins +16, normal paps since. If normal today, we can return to every 3-year screenings per guidelines.   Hyperlipidemia, unspecified hyperlipidemia type - Plan: Lipid panel  Encounter for surveillance of transdermal patch hormonal contraceptive device - Plan: norelgestromin-ethinyl estradiol Marilu Favre) 150-35 MCG/24HR transdermal patch weekly. Doing well on this and wants to continue. Refill x 1 year provided.  Screening for breast cancer - Normal mammogram history. Continue annual screenings. Normal breast exam today.  Return in 1 year for annual.    Tamela Gammon DNP, 8:55 AM 02/15/2022

## 2022-02-15 ENCOUNTER — Ambulatory Visit (INDEPENDENT_AMBULATORY_CARE_PROVIDER_SITE_OTHER): Payer: BC Managed Care – PPO | Admitting: Nurse Practitioner

## 2022-02-15 ENCOUNTER — Other Ambulatory Visit (HOSPITAL_COMMUNITY)
Admission: RE | Admit: 2022-02-15 | Discharge: 2022-02-15 | Disposition: A | Discharge status: Home / Self Care | Payer: BC Managed Care – PPO | Source: Ambulatory Visit | Attending: Nurse Practitioner | Admitting: Nurse Practitioner

## 2022-02-15 ENCOUNTER — Encounter: Payer: Self-pay | Admitting: Nurse Practitioner

## 2022-02-15 VITALS — BP 144/86 | Ht 65.0 in | Wt 197.0 lb

## 2022-02-15 DIAGNOSIS — D069 Carcinoma in situ of cervix, unspecified: Secondary | ICD-10-CM

## 2022-02-15 DIAGNOSIS — Z01419 Encounter for gynecological examination (general) (routine) without abnormal findings: Secondary | ICD-10-CM | POA: Insufficient documentation

## 2022-02-15 DIAGNOSIS — Z3045 Encounter for surveillance of transdermal patch hormonal contraceptive device: Secondary | ICD-10-CM | POA: Diagnosis not present

## 2022-02-15 DIAGNOSIS — E785 Hyperlipidemia, unspecified: Secondary | ICD-10-CM

## 2022-02-15 MED ORDER — XULANE 150-35 MCG/24HR TD PTWK: 1.0000 | MEDICATED_PATCH | TRANSDERMAL | 4 refills | Status: DC

## 2022-02-16 LAB — LIPID PANEL
Cholesterol: 250 mg/dL — ABNORMAL HIGH
HDL: 80 mg/dL
LDL Cholesterol (Calc): 147 mg/dL — ABNORMAL HIGH
Non-HDL Cholesterol (Calc): 170 mg/dL — ABNORMAL HIGH
Total CHOL/HDL Ratio: 3.1 (calc)
Triglycerides: 115 mg/dL

## 2022-02-16 LAB — CBC WITH DIFFERENTIAL/PLATELET
Absolute Monocytes: 467 cells/uL (ref 200–950)
Basophils Absolute: 41 cells/uL (ref 0–200)
Basophils Relative: 0.5 %
Eosinophils Absolute: 107 cells/uL (ref 15–500)
Eosinophils Relative: 1.3 %
HCT: 37.9 % (ref 35.0–45.0)
Hemoglobin: 13 g/dL (ref 11.7–15.5)
Lymphs Abs: 3108 cells/uL (ref 850–3900)
MCH: 30.4 pg (ref 27.0–33.0)
MCHC: 34.3 g/dL (ref 32.0–36.0)
MCV: 88.6 fL (ref 80.0–100.0)
MPV: 10 fL (ref 7.5–12.5)
Monocytes Relative: 5.7 %
Neutro Abs: 4477 cells/uL (ref 1500–7800)
Neutrophils Relative %: 54.6 %
Platelets: 407 10*3/uL — ABNORMAL HIGH (ref 140–400)
RBC: 4.28 10*6/uL (ref 3.80–5.10)
RDW: 13.1 % (ref 11.0–15.0)
Total Lymphocyte: 37.9 %
WBC: 8.2 10*3/uL (ref 3.8–10.8)

## 2022-02-16 LAB — COMPREHENSIVE METABOLIC PANEL WITH GFR
AG Ratio: 1.3 (calc) (ref 1.0–2.5)
ALT: 10 U/L (ref 6–29)
AST: 13 U/L (ref 10–30)
Albumin: 3.9 g/dL (ref 3.6–5.1)
Alkaline phosphatase (APISO): 105 U/L (ref 31–125)
BUN: 8 mg/dL (ref 7–25)
CO2: 27 mmol/L (ref 20–32)
Calcium: 9.5 mg/dL (ref 8.6–10.2)
Chloride: 103 mmol/L (ref 98–110)
Creat: 0.78 mg/dL (ref 0.50–0.99)
Globulin: 3 g/dL (ref 1.9–3.7)
Glucose, Bld: 87 mg/dL (ref 65–99)
Potassium: 4.3 mmol/L (ref 3.5–5.3)
Sodium: 140 mmol/L (ref 135–146)
Total Bilirubin: 0.3 mg/dL (ref 0.2–1.2)
Total Protein: 6.9 g/dL (ref 6.1–8.1)

## 2022-02-16 LAB — CYTOLOGY - PAP
Comment: NEGATIVE
Diagnosis: NEGATIVE
High risk HPV: NEGATIVE

## 2022-05-10 ENCOUNTER — Ambulatory Visit: Payer: BC Managed Care – PPO | Admitting: Radiology

## 2022-05-10 VITALS — BP 110/84

## 2022-05-10 DIAGNOSIS — R350 Frequency of micturition: Secondary | ICD-10-CM

## 2022-05-10 DIAGNOSIS — N898 Other specified noninflammatory disorders of vagina: Secondary | ICD-10-CM

## 2022-05-10 LAB — WET PREP FOR TRICH, YEAST, CLUE

## 2022-05-10 NOTE — Progress Notes (Signed)
      Subjective: Madison Combs is a 41 y.o. female who complains of vaginal discharge, urinary frequency, slight dysuria, urgency x 5 days. Holding her urine more being back in school.    Review of Systems  All other systems reviewed and are negative.   Past Medical History:  Diagnosis Date   Ulcer       Objective:  Today's Vitals   05/10/22 1110  BP: 110/84   There is no height or weight on file to calculate BMI.   -General: no acute distress -Vulva: without lesions or discharge -Vagina: discharge present, aptima swab and wet prep obtained -Cervix: no lesion or discharge, no CMT -Perineum: no lesions -Uterus: Mobile, non tender -Adnexa: no masses or tenderness  Urine dipstick shows positive for protein, positive for leukocytes, and positive for ketones.  Micro exam: 0-5 WBC's per HPF, 0-2 RBC's per HPF, and few+ bacteria.  Microscopic wet-mount exam shows negative for pathogens, normal epithelial cells.   Chaperone offered and declined.  Assessment:/Plan:   1. Vaginal discharge Negative wet prep - WET PREP FOR TRICH, YEAST, CLUE  2. Urinary frequency  - Urinalysis,Complete w/RFL Culture    Will contact patient with results of testing completed today. Avoid intercourse until symptoms are resolved. Safe sex encouraged. Avoid the use of soaps or perfumed products in the peri area. Avoid tub baths and sitting in sweaty or wet clothing for prolonged periods of time.

## 2022-05-12 LAB — URINE CULTURE
MICRO NUMBER:: 13820209
SPECIMEN QUALITY:: ADEQUATE

## 2022-05-12 LAB — URINALYSIS, COMPLETE W/RFL CULTURE
Bilirubin Urine: NEGATIVE
Casts: NONE SEEN /LPF
Crystals: NONE SEEN /HPF
Glucose, UA: NEGATIVE
Hgb urine dipstick: NEGATIVE
Hyaline Cast: NONE SEEN /LPF
Nitrites, Initial: NEGATIVE
Specific Gravity, Urine: 1.031 (ref 1.001–1.035)
Yeast: NONE SEEN /HPF
pH: 5.5 (ref 5.0–8.0)

## 2022-05-12 LAB — CULTURE INDICATED

## 2022-07-01 IMAGING — MG MM DIGITAL SCREENING BILAT W/ TOMO AND CAD
8 series · 8 of 24 positions shown · non-contrast
Comparison: Previous exam(s).

CLINICAL DATA: Screening.

EXAM:
DIGITAL SCREENING BILATERAL MAMMOGRAM WITH TOMOSYNTHESIS AND CAD
TECHNIQUE: Bilateral screening digital craniocaudal and mediolateral oblique
mammograms were obtained. Bilateral screening digital breast
tomosynthesis was performed. The images were evaluated with
computer-aided detection.

[L CC synth-2D]
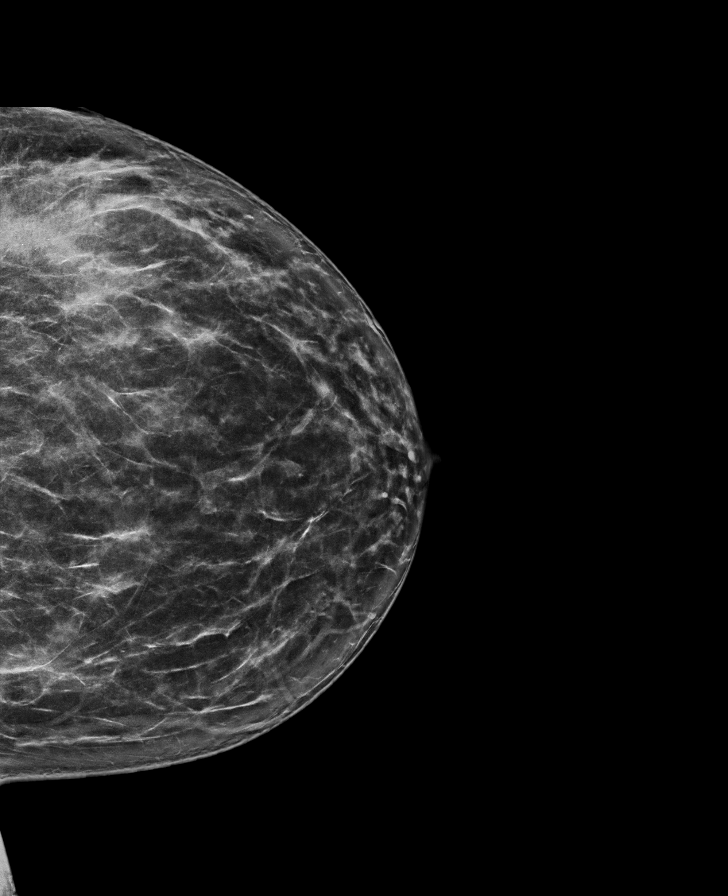

[R CC synth-2D]
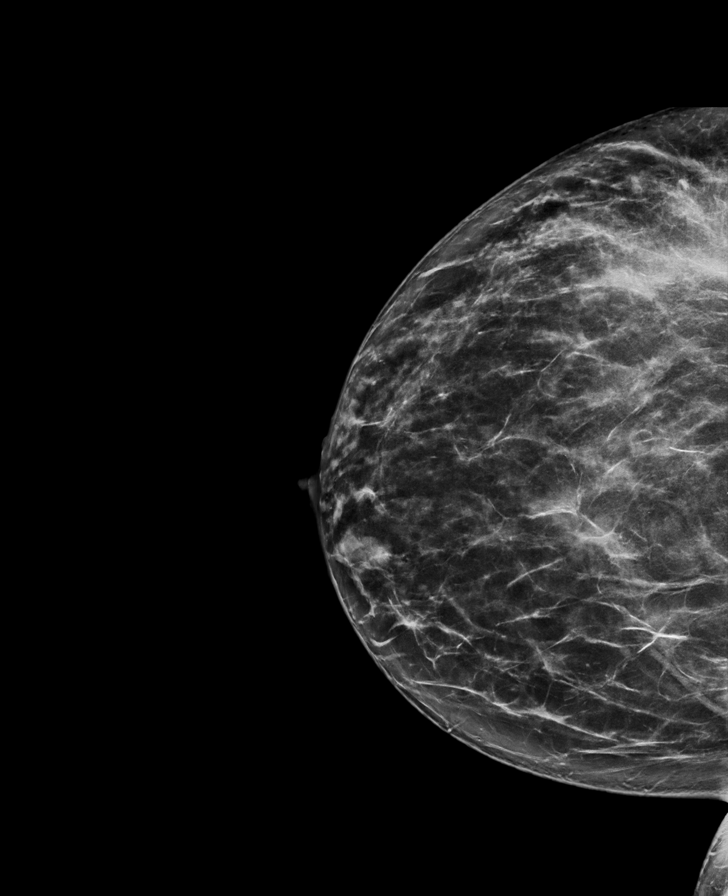

[L MLO synth-2D]
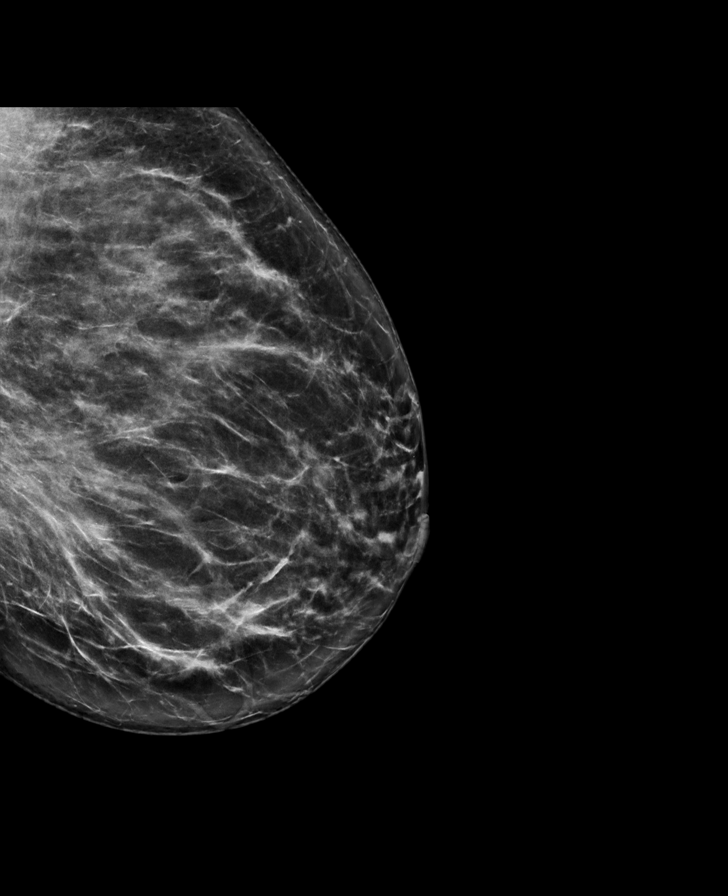

[R MLO synth-2D]
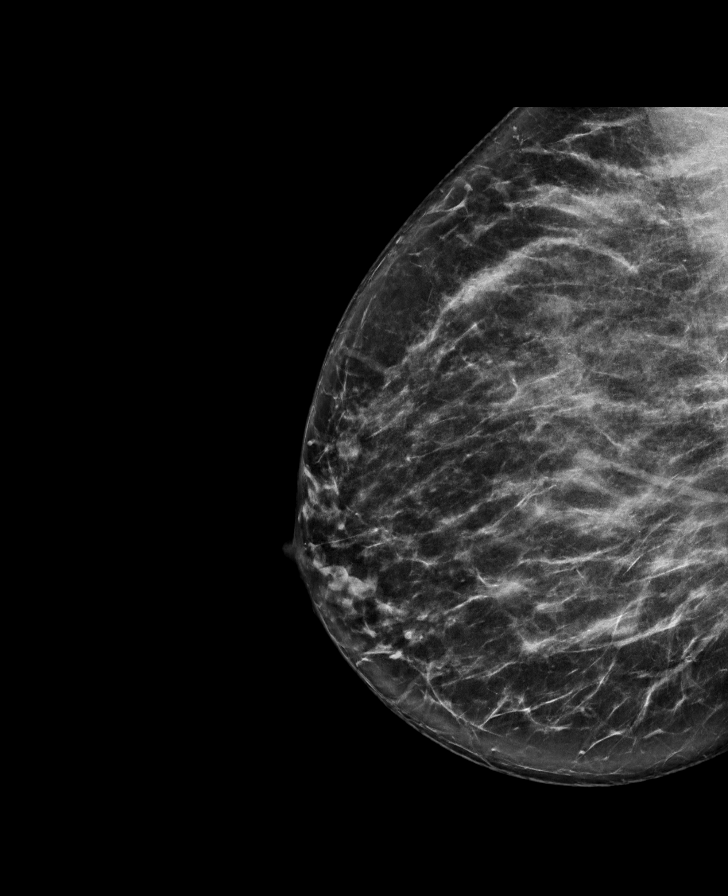

[R MLO tomo · tomo slice 44/87.0]
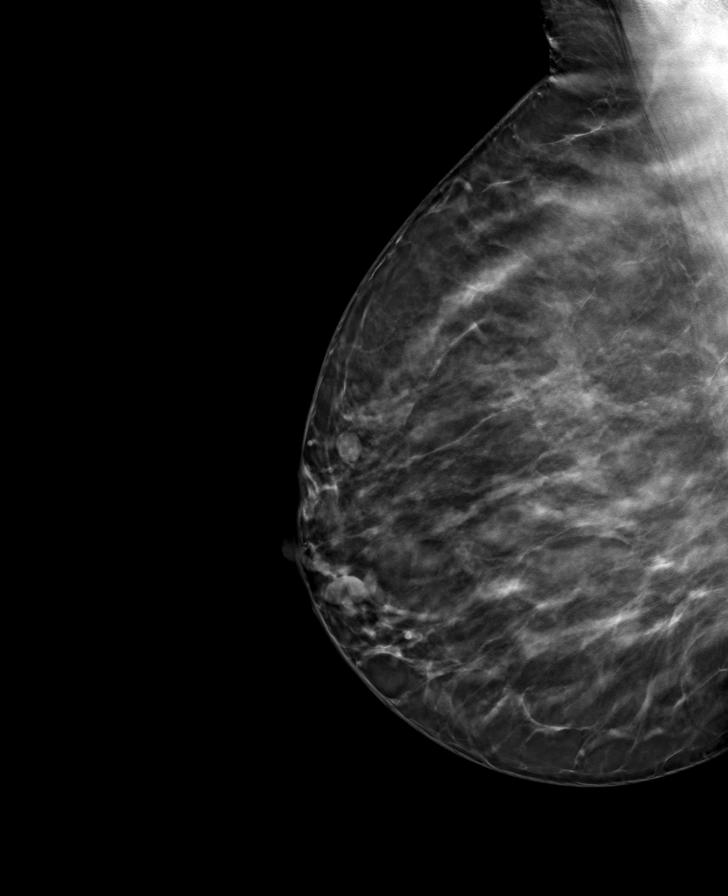

[R CC tomo · tomo slice 43/85.0]
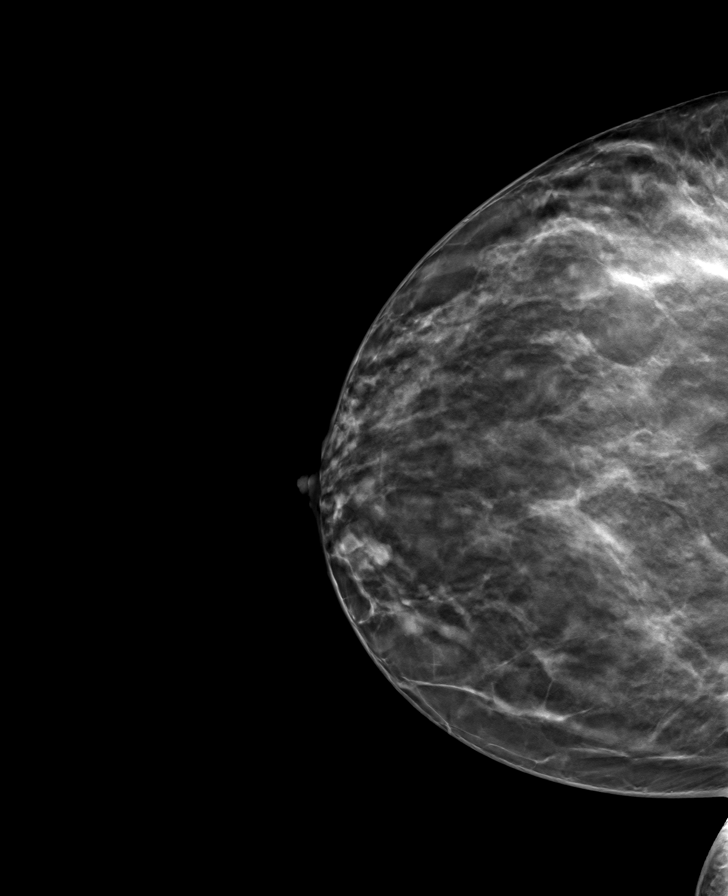

[L MLO tomo · tomo slice 45/89.0]
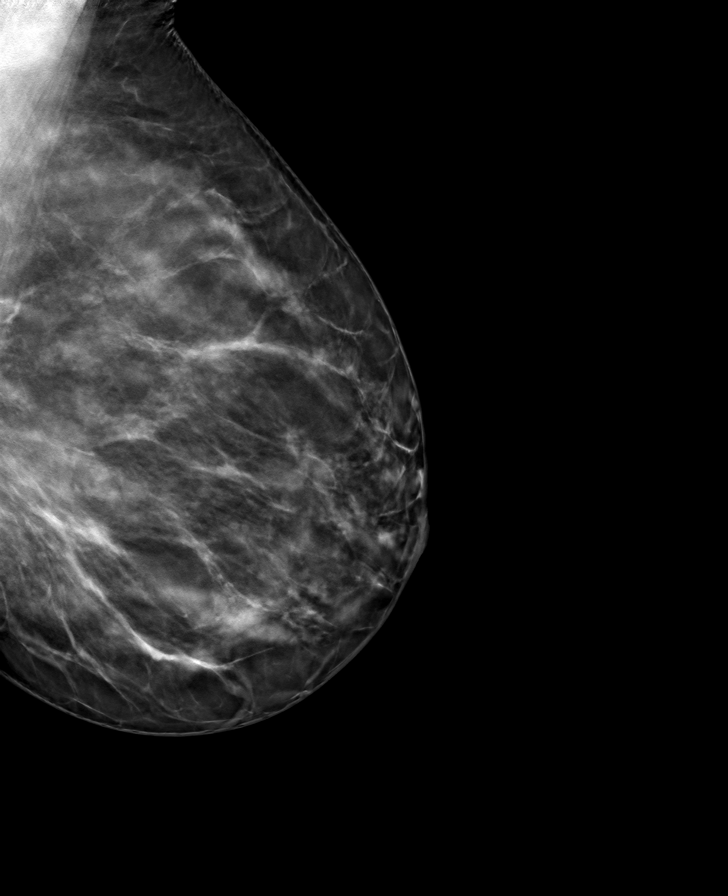

[L CC tomo · tomo slice 43/84.0]
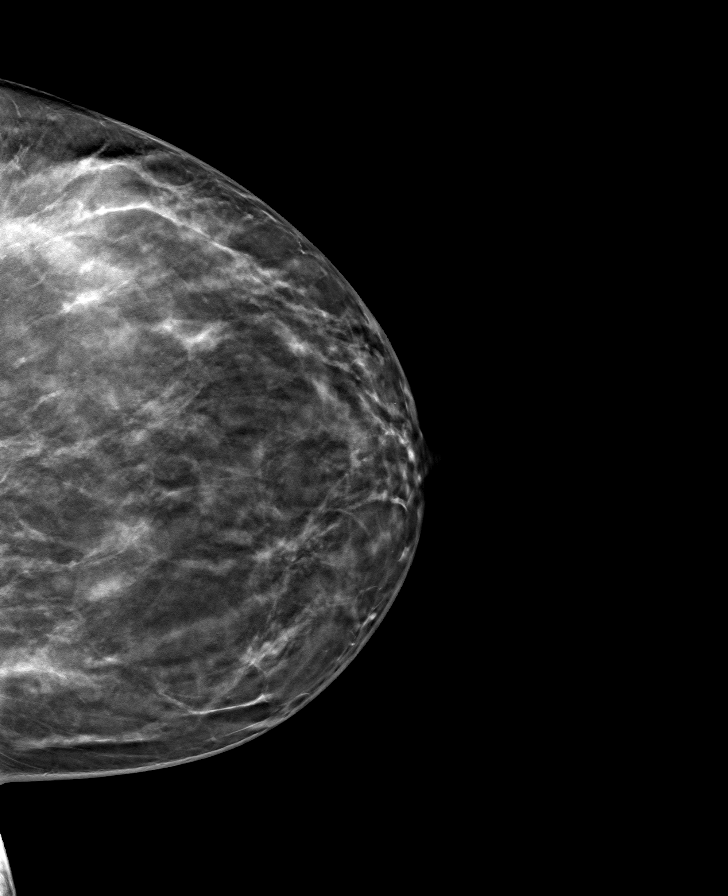

[8 of 24 positions shown; findings below may reference images not displayed]

ACR Breast Density Category c: The breast tissue is heterogeneously
dense, which may obscure small masses.
FINDINGS: There are no findings suspicious for malignancy. The images were
evaluated with computer-aided detection.
IMPRESSION: No mammographic evidence of malignancy. A result letter of this
screening mammogram will be mailed directly to the patient.

RECOMMENDATION:
Screening mammogram in one year. (Code:T4-5-GWO)

BI-RADS CATEGORY  1: Negative.

## 2023-02-03 ENCOUNTER — Other Ambulatory Visit: Payer: Self-pay | Admitting: Nurse Practitioner

## 2023-02-03 DIAGNOSIS — Z3045 Encounter for surveillance of transdermal patch hormonal contraceptive device: Secondary | ICD-10-CM

## 2023-02-05 NOTE — Telephone Encounter (Signed)
Med refill request:Xulane Last AEX: 02/15/22 Next AEX: 02/27/23 Last MMG (if hormonal med) 02/10/22 Refill authorized: Please Advise?

## 2023-02-21 ENCOUNTER — Encounter: Payer: Self-pay | Admitting: Nurse Practitioner

## 2023-02-27 ENCOUNTER — Encounter: Payer: Self-pay | Admitting: Nurse Practitioner

## 2023-02-27 ENCOUNTER — Ambulatory Visit (INDEPENDENT_AMBULATORY_CARE_PROVIDER_SITE_OTHER): Payer: BC Managed Care – PPO | Admitting: Nurse Practitioner

## 2023-02-27 VITALS — BP 128/74 | HR 98 | Ht 64.37 in | Wt 204.8 lb

## 2023-02-27 DIAGNOSIS — Z3045 Encounter for surveillance of transdermal patch hormonal contraceptive device: Secondary | ICD-10-CM

## 2023-02-27 DIAGNOSIS — E785 Hyperlipidemia, unspecified: Secondary | ICD-10-CM

## 2023-02-27 DIAGNOSIS — Z01419 Encounter for gynecological examination (general) (routine) without abnormal findings: Secondary | ICD-10-CM | POA: Diagnosis not present

## 2023-02-27 DIAGNOSIS — J302 Other seasonal allergic rhinitis: Secondary | ICD-10-CM | POA: Insufficient documentation

## 2023-02-27 MED ORDER — NORELGESTROMIN-ETH ESTRADIOL 150-35 MCG/24HR TD PTWK: 1.0000 | MEDICATED_PATCH | TRANSDERMAL | 3 refills | Status: DC

## 2023-02-27 NOTE — Progress Notes (Signed)
   Madison Combs [birth year] [id number]   History:  42 y.o. G2P0011 presents for annual exam without GYN complaints. Monthly cycle/Xulane patches. 2018 normal cytology positive HPV, 2020 ASCUS positive HPV with LEEP CIN-3 with negative margins positive 16, normal paps since. Normal mammogram history.   Gynecologic History Patient's last menstrual period was 01/31/2023 (approximate). Period Cycle (Days): -1 Period Pattern: Regular Menstrual Flow: Light, Moderate, Heavy Menstrual Control: Panty liner, Thin pad Dysmenorrhea: None (Headaches -1 week prior to menses, intense headaches; for the past year) Contraception/Family planning: Ortho-Evra patches weekly Sexually active: No  Health Maintenance Last Pap: 02/15/2022. Results were: Normal neg HPV, 3-year repeat Last mammogram: 02/20/2023. Results were: Normal Last colonoscopy: Not indicated Last Dexa: Not indicated  Past medical history, past surgical history, family history and social history were all reviewed and documented in the EPIC chart. Widowed. 42 yo daughter. 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   ROS:  A ROS was performed and pertinent positives and negatives are included.  Exam:  Vitals:   02/27/23 1322  BP: 128/74  Pulse: 98  SpO2: 97%  Weight: 204 lb 12.8 oz (92.9 kg)  Height: 5' 4.37" (1.635 m)    Body mass index is 34.75 kg/m.  General appearance:  Normal Thyroid:  Symmetrical, normal in size, without palpable masses or nodularity. Respiratory  Auscultation:  Clear without wheezing or rhonchi Cardiovascular  Auscultation:  Regular rate, without rubs, murmurs or gallops  Edema/varicosities:  Not grossly evident Abdominal  Soft,nontender, without masses, guarding or rebound.  Liver/spleen:  No organomegaly noted  Hernia:  None appreciated  Skin  Inspection:  Grossly normal Breasts: Examined lying and sitting.   Right: Without masses, retractions, nipple discharge or axillary adenopathy.   Left: Without masses,  retractions, nipple discharge or axillary adenopathy. Genitourinary   Inguinal/mons:  Normal without inguinal adenopathy  External genitalia:  Normal appearing vulva with no masses, tenderness, or lesions  BUS/Urethra/Skene's glands:  Normal  Vagina:  Normal appearing with normal color and discharge, no lesions  Cervix:  Normal appearing without discharge or lesions  Uterus:  Normal in size, shape and contour.  Midline and mobile, nontender  Adnexa/parametria:     Rt: Normal in size, without masses or tenderness.   Lt: Normal in size, without masses or tenderness.  Anus and perineum: Normal  Digital rectal exam: Declines  Patient informed chaperone available to be present for breast and pelvic exam. Patient has requested no chaperone to be present. Patient has been advised what will be completed during breast and pelvic exam.   Assessment/Plan:  42 y.o. G2P0011 for annual exam.   Well female exam with routine gynecological exam - Plan: CBC with Differential/Platelet, Comprehensive metabolic panel. Education provided on SBEs, importance of preventative screenings, current guidelines, high calcium diet, regular exercise, and multivitamin daily. Will return for fasting labs.   High grade squamous intraepithelial lesion (HGSIL), grade 3 CIN, on biopsy of cervix. 2020 LEEP CIN 3 with negative margins +16, normal annual paps x 3. Will repeat at 3-year interval per guidelines.   Hyperlipidemia, unspecified hyperlipidemia type - Plan: Lipid panel  Encounter for surveillance of transdermal patch hormonal contraceptive device - Plan: norelgestromin-ethinyl estradiol Burr Medico) 150-35 MCG/24HR transdermal patch weekly. Doing well on this and wants to continue. Refill x 1 year provided.  Screening for breast cancer - Normal mammogram history. Continue annual screenings. Normal breast exam today.  Return in 1 year for annual.    Olivia Mackie DNP, 1:48 PM 02/27/2023

## 2023-03-01 ENCOUNTER — Other Ambulatory Visit: Payer: BC Managed Care – PPO

## 2023-03-01 DIAGNOSIS — Z01419 Encounter for gynecological examination (general) (routine) without abnormal findings: Secondary | ICD-10-CM

## 2023-03-01 DIAGNOSIS — E785 Hyperlipidemia, unspecified: Secondary | ICD-10-CM

## 2023-03-01 LAB — CBC WITH DIFFERENTIAL/PLATELET
Absolute Monocytes: 400 cells/uL (ref 200–950)
Basophils Absolute: 28 cells/uL (ref 0–200)
Basophils Relative: 0.4 %
Eosinophils Absolute: 48 cells/uL (ref 15–500)
Eosinophils Relative: 0.7 %
HCT: 39.7 % (ref 35.0–45.0)
Hemoglobin: 13.2 g/dL (ref 11.7–15.5)
Lymphs Abs: 2926 cells/uL (ref 850–3900)
MCH: 29.3 pg (ref 27.0–33.0)
MCHC: 33.2 g/dL (ref 32.0–36.0)
MCV: 88.2 fL (ref 80.0–100.0)
MPV: 9.5 fL (ref 7.5–12.5)
Monocytes Relative: 5.8 %
Neutro Abs: 3498 cells/uL (ref 1500–7800)
Neutrophils Relative %: 50.7 %
Platelets: 434 10*3/uL — ABNORMAL HIGH (ref 140–400)
RBC: 4.5 10*6/uL (ref 3.80–5.10)
RDW: 13.1 % (ref 11.0–15.0)
Total Lymphocyte: 42.4 %
WBC: 6.9 10*3/uL (ref 3.8–10.8)

## 2023-03-01 LAB — COMPREHENSIVE METABOLIC PANEL
AG Ratio: 1.3 (calc) (ref 1.0–2.5)
ALT: 11 U/L (ref 6–29)
AST: 15 U/L (ref 10–30)
Albumin: 4.1 g/dL (ref 3.6–5.1)
Alkaline phosphatase (APISO): 117 U/L (ref 31–125)
BUN: 9 mg/dL (ref 7–25)
CO2: 28 mmol/L (ref 20–32)
Calcium: 9.4 mg/dL (ref 8.6–10.2)
Chloride: 103 mmol/L (ref 98–110)
Creat: 0.85 mg/dL (ref 0.50–0.99)
Globulin: 3.1 g/dL (calc) (ref 1.9–3.7)
Glucose, Bld: 94 mg/dL (ref 65–99)
Potassium: 4.3 mmol/L (ref 3.5–5.3)
Sodium: 140 mmol/L (ref 135–146)
Total Bilirubin: 0.4 mg/dL (ref 0.2–1.2)
Total Protein: 7.2 g/dL (ref 6.1–8.1)

## 2023-03-01 LAB — LIPID PANEL
Cholesterol: 258 mg/dL — ABNORMAL HIGH (ref <200)
HDL: 79 mg/dL (ref >=50)
LDL Cholesterol (Calc): 157 mg/dL (calc) — ABNORMAL HIGH
Non-HDL Cholesterol (Calc): 179 mg/dL (calc) — ABNORMAL HIGH (ref <130)
Total CHOL/HDL Ratio: 3.3 (calc) (ref <5.0)
Triglycerides: 105 mg/dL (ref <150)

## 2023-03-28 ENCOUNTER — Ambulatory Visit: Payer: BC Managed Care – PPO | Admitting: Obstetrics & Gynecology

## 2023-03-28 ENCOUNTER — Encounter: Payer: Self-pay | Admitting: Obstetrics & Gynecology

## 2023-03-28 VITALS — BP 130/84 | HR 97

## 2023-03-28 DIAGNOSIS — R300 Dysuria: Secondary | ICD-10-CM

## 2023-03-28 DIAGNOSIS — R350 Frequency of micturition: Secondary | ICD-10-CM

## 2023-03-28 DIAGNOSIS — N898 Other specified noninflammatory disorders of vagina: Secondary | ICD-10-CM

## 2023-03-28 LAB — WET PREP FOR TRICH, YEAST, CLUE

## 2023-03-28 MED ORDER — SULFAMETHOXAZOLE-TRIMETHOPRIM 800-160 MG PO TABS: 1.0000 | ORAL_TABLET | Freq: Two times a day (BID) | ORAL | 0 refills | Status: AC

## 2023-03-28 NOTE — Progress Notes (Signed)
    Madison Combs 1980/11/18 [id number]        42 y.o.  G2P0011   RP: Vaginal burning and itching with urinary frequency  HPI: Patient c/o vaginal burning and itching.  Urinary frequency.  No other UTI Sxs. No fever.   OB History  Gravida Para Term Preterm AB Living  2 1     1 1   SAB IAB Ectopic Multiple Live Births  0   0        # Outcome Date GA Lbr Len/2nd Weight Sex Delivery Anes PTL Lv  2 AB           1 Para             Past medical history,surgical history, problem list, medications, allergies, family history and social history were all reviewed and documented in the EPIC chart.   Directed ROS with pertinent positives and negatives documented in the history of present illness/assessment and plan.  Exam:  Vitals:   03/28/23 1332  BP: 130/84  Pulse: 97  SpO2: 100%   General appearance:  Normal  Abdomen: Normal  Gynecologic exam: Vulva normal.  Speculum:  Cervix/vagina normal.  Mild dark menstrual blood present.  Wet prep done.  Wet Prep: Negative  U/A: Amber, cloudy, Pro +, Nit Neg, Leu Neg, WBC 0-5, RBC 20-40 (menstruating), Bacteria Moderate.  Pending U. Culture.   Assessment/Plan:  42 y.o. G2P0011   1. Urinary frequency U/A abnormal.  Could be contamination from menstruation, but will treat with Bactrim DS BID x 3 days.  Usage reviewed, prescription sent to pharmacy. - Urinalysis,Complete w/RFL Culture  2. Dysuria As above. - Urinalysis,Complete w/RFL Culture  3. Vaginal itching Wet prep Neg, reassured. - WET PREP FOR TRICH, YEAST, CLUE  Other orders - sulfamethoxazole-trimethoprim (BACTRIM DS) 800-160 MG tablet; Take 1 tablet by mouth 2 (two) times daily for 3 days.   Genia Del MD, 1:47 PM 03/28/2023

## 2023-03-30 LAB — URINALYSIS, COMPLETE W/RFL CULTURE
Glucose, UA: NEGATIVE
Hyaline Cast: NONE SEEN /LPF
Leukocyte Esterase: NEGATIVE
Nitrites, Initial: NEGATIVE
Specific Gravity, Urine: 1.031 (ref 1.001–1.035)
pH: 5.5 (ref 5.0–8.0)

## 2023-03-30 LAB — URINE CULTURE
MICRO NUMBER:: 15182249
Result:: NO GROWTH
SPECIMEN QUALITY:: ADEQUATE

## 2023-03-30 LAB — CULTURE INDICATED

## 2023-08-01 NOTE — Progress Notes (Signed)
GYNECOLOGY  VISIT   HPI: 42 y.o.   Widowed  [place] female   [phone] with Patient's last menstrual period was 07/19/2023.   here for: right breast lump.  She denies pain.   Three days ago developed an area on right breast like an ingrown hair. It had a dark coloration to it.  She tried to pop it, and it feels firm underneath.  Not painful.   She is worried about it.   No hx abnormal mammograms or FH of breast cancer.   She is happy with her Burr Medico, taking for 20 years.   Asking about perimenopause.  Hot all the time.  Joint pain.  Still having menses.  GYNECOLOGIC HISTORY: Patient's last menstrual period was 07/19/2023. Contraception:  Xulane Menopausal hormone therapy:  n/a Pap:      Component Value Date/Time   DIAGPAP  02/15/2022 0847    - Negative for intraepithelial lesion or malignancy (NILM)   DIAGPAP  02/10/2021 1044    - Negative for intraepithelial lesion or malignancy (NILM)   HPVHIGH Negative 02/15/2022 0847   HPVHIGH Negative 02/10/2021 1044   ADEQPAP  02/15/2022 0847    Satisfactory for evaluation; transformation zone component PRESENT.   ADEQPAP  02/10/2021 1044    Satisfactory for evaluation; transformation zone component PRESENT.    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  Current Outpatient Medications  Medication Sig Dispense Refill   fluticasone (FLONASE) 50 MCG/ACT nasal spray Place 2 sprays into both nostrils 2 (two) times daily.     norelgestromin-ethinyl  estradiol Burr Medico) 150-35 MCG/24HR transdermal patch Place 1 patch onto the skin once a week. 9 patch 3   PREVIDENT 5000 ENAMEL PROTECT 1.1-5 % PSTE as directed.  5   No current facility-administered medications for this visit.     ALLERGIES: Ibuprofen  Family History  Problem Relation Age of Onset   Hypertension Mother    Diabetes Father    Stroke Father    Hypertension Maternal Grandmother    Cancer Maternal Grandmother        CERVICAL    Social History   Socioeconomic History   Marital status: Widowed    Spouse name: Not on file   Number of children: Not on file   Years of education: Not on file   Highest education level: Not on file  Occupational History   Not on file  Tobacco Use   Smoking status: Never   Smokeless tobacco: Never  Vaping Use   Vaping status: Never Used  Substance and Sexual Activity   Alcohol use: No    Alcohol/week: 0.0 standard drinks of alcohol   Drug  use: No   Sexual activity: Not Currently    Birth control/protection: Patch    Comment: 1st intercourse 42 yo-More than 5 partners  Other Topics Concern   Not on file  Social History Narrative   Not on file   Social Determinants of Health   Financial Resource Strain: Not on file  Food Insecurity: Not on file  Transportation Needs: Not on file  Physical Activity: Not on file  Stress: Not on file  Social Connections: Not on file  Intimate Partner Violence: Not on file    Review of Systems  All other systems reviewed and are negative.   PHYSICAL EXAMINATION:   BP 122/62   Pulse 73   Wt 189 lb (85.7 kg)   LMP 07/19/2023   SpO2 100%   BMI 32.07 kg/m     General appearance: alert, cooperative and appears stated age  Breasts: left - normal appearance, no masses or tenderness, No nipple retraction or dimpling, No nipple discharge or bleeding, No axillary or supraclavicular adenopathy Right superior breast skin at breast edge at 10:00 - 11:00 position - 8 mm subcutaneous firm mass  with hair follicle.  No tenderness, No nipple retraction or dimpling, No nipple discharge or bleeding, No axillary or supraclavicular adenopathy   Chaperone was present for exam:  Antony Salmon, CMA  ASSESSMENT:  Probable sebaceous cyst of right breast skin. Perimenopausal symptoms.   PLAN:  Dx right mammogram and [place] at [facility].  We discussed sebaceous cysts.  Perimenopause briefly discussed.  Fu prn.   21 min  total time was spent for this patient encounter, including preparation, face-to-face counseling with the patient, coordination of care, and documentation of the encounter.  An After Visit Summary was provided to the patient.

## 2023-08-02 ENCOUNTER — Ambulatory Visit: Payer: BC Managed Care – PPO | Admitting: Obstetrics and Gynecology

## 2023-08-02 ENCOUNTER — Telehealth: Payer: Self-pay | Admitting: Obstetrics and Gynecology

## 2023-08-02 VITALS — BP 122/62 | HR 73 | Wt 189.0 lb

## 2023-08-02 DIAGNOSIS — N6311 Unspecified lump in the right breast, upper outer quadrant: Secondary | ICD-10-CM

## 2023-08-02 DIAGNOSIS — N951 Menopausal and female climacteric states: Secondary | ICD-10-CM

## 2023-08-02 NOTE — Telephone Encounter (Signed)
Please schedule dx right mammogram and right breast US at [facility] for right breast mass - 8 mm at about 10:00 - 11:00.

## 2023-08-02 NOTE — Patient Instructions (Addendum)
Perimenopause Perimenopause is the normal time of a woman's life when the levels of estrogen, the female hormone produced by the ovaries, begin to decrease. This leads to changes in menstrual periods before they stop completely (menopause). Perimenopause can begin 2-8 years before menopause. During perimenopause, the ovaries may or may not produce an egg and a woman can still become pregnant. What are the causes? This condition is caused by a natural change in hormone levels that happens as you get older. What increases the risk? This condition is more likely to start at an earlier age if you have certain medical conditions or have undergone treatments, including: A tumor of the pituitary gland in the brain. A disease that affects the ovaries and hormone production. Certain cancer treatments, such as chemotherapy or hormone therapy, or radiation therapy on the pelvis. Heavy smoking and excessive alcohol use. Family history of early menopause. What are the signs or symptoms? Perimenopausal changes affect each woman differently. Symptoms of this condition may include: Hot flashes. Irregular menstrual periods. Night sweats. Changes in feelings about sex. This could be a decrease in sex drive or an increased discomfort around your sexuality. Vaginal dryness. Headaches. Mood swings. Depression. Problems sleeping (insomnia). Memory problems or trouble concentrating. Irritability. Tiredness. Weight gain. Anxiety. Trouble getting pregnant. How is this diagnosed? This condition is diagnosed based on your medical history, a physical exam, your age, your menstrual history, and your symptoms. Hormone tests may also be done. How is this treated? In some cases, no treatment is needed. You and your health care provider should make a decision together about whether treatment is necessary. Treatment will be based on your individual condition and preferences. Various treatments are available, such  as: Menopausal hormone therapy (MHT). Medicines to treat specific symptoms. Acupuncture. Vitamin or herbal supplements. Before starting treatment, make sure to let your health care provider know if you have a personal or family history of: Heart disease. Breast cancer. Blood clots. Diabetes. Osteoporosis. Follow these instructions at home: Medicines Take over-the-counter and prescription medicines only as told by your health care provider. Take vitamin supplements only as told by your health care provider. Talk with your health care provider before starting any herbal supplements. Lifestyle  Do not use any products that contain nicotine or tobacco, such as cigarettes, e-cigarettes, and chewing tobacco. If you need help quitting, ask your health care provider. Get at least 30 minutes of physical activity on 5 or more days each week. Eat a balanced diet that includes fresh fruits and vegetables, whole grains, soybeans, eggs, lean meat, and low-fat dairy. Avoid alcoholic and caffeinated beverages, as well as spicy foods. This may help prevent hot flashes. Get 7-8 hours of sleep each night. Dress in layers that can be removed to help you manage hot flashes. Find ways to manage stress, such as deep breathing, meditation, or journaling. General instructions  Keep track of your menstrual periods, including: When they occur. How heavy they are and how long they last. How much time passes between periods. Keep track of your symptoms, noting when they start, how often you have them, and how long they last. Use vaginal lubricants or moisturizers to help with vaginal dryness and improve comfort during sex. You can still become pregnant if you are having irregular periods. Make sure you use contraception during perimenopause if you do not want to get pregnant. Keep all follow-up visits. This is important. This includes any group therapy or counseling. Contact a health care provider if: You  have  heavy vaginal bleeding or pass blood clots. Your period lasts more than 2 days longer than normal. Your periods are recurring sooner than 21 days. You bleed after having sex. You have pain during sex. Get help right away if you have: Chest pain, trouble breathing, or trouble talking. Severe depression. Pain when you urinate. Severe headaches. Vision problems. Summary Perimenopause is the time when a woman's body begins to move into menopause. This may happen naturally or as a result of other health problems or medical treatments. Perimenopause can begin 2-8 years before menopause, and it can last for several years. Perimenopausal symptoms can be managed through medicines, lifestyle changes, and complementary therapies such as acupuncture. This information is not intended to replace advice given to you by your health care provider. Make sure you discuss any questions you have with your health care provider. Document Revised: 02/19/2020 Document Reviewed: 02/19/2020 Elsevier Patient Education  2024 [employer] Inc.   Epidermoid Cyst  An epidermoid cyst, also called an epidermal cyst, is a small lump under your skin. The cyst contains a substance called keratin. Do not try to pop or open the cyst yourself. What are the causes? A blocked hair follicle. A hair that curls and re-enters the skin instead of growing straight out of the skin. A blocked pore. Irritated skin. An injury to the skin. Certain conditions that are passed along from parent to child. Human papillomavirus (HPV). This happens rarely when cysts occur on the bottom of the feet. Long-term sun damage to the skin. What increases the risk? Having acne. Being female. Having an injury to the skin. Being past puberty. Having certain conditions caused by genes (genetic disorder) What are the signs or symptoms? These cysts are usually harmless, but they can get infected. Symptoms of infection may  include: Redness. Inflammation. Tenderness. Warmth. Fever. A bad-smelling substance that drains from the cyst. Pus that drains from the cyst. How is this treated? In many cases, epidermoid cysts go away on their own without treatment. If a cyst becomes infected, treatment may include: Opening and draining the cyst, done by a doctor. After draining, you may need minor surgery to remove the rest of the cyst. Antibiotic medicine. Shots of medicines (steroids) that help to reduce inflammation. Surgery to remove the cyst. Surgery may be done if the cyst: Becomes large. Bothers you. Has a chance of turning into cancer. Do not try to open a cyst yourself. Follow these instructions at home: Medicines Take over-the-counter and prescription medicines as told by your doctor. If you were prescribed an antibiotic medicine, take it as told by your doctor. Do not stop taking it even if you start to feel better. General instructions Keep the area around your cyst clean and dry. Wear loose, dry clothing. Avoid touching your cyst. Check your cyst every day for signs of infection. Check for: Redness, swelling, or pain. Fluid or blood. Warmth. Pus or a bad smell. Keep all follow-up visits. How is this prevented? Wear clean, dry, clothing. Avoid wearing tight clothing. Keep your skin clean and dry. Take showers or baths every day. Contact a doctor if: Your cyst has symptoms of infection. Your condition does not improve or gets worse. You have a cyst that looks different from other cysts you have had. You have a fever. Get help right away if: Redness spreads from the cyst into the area close by. Summary An epidermoid cyst is a small lump under your skin. If a cyst becomes infected, treatment may include surgery to  open and drain the cyst, or to remove it. Take over-the-counter and prescription medicines only as told by your doctor. Contact a doctor if your condition is not improving or is  getting worse. Keep all follow-up visits. This information is not intended to replace advice given to you by your health care provider. Make sure you discuss any questions you have with your health care provider. Document Revised: 12/09/2019 Document Reviewed: 12/10/2019 Elsevier Patient Education  2024 [employer].

## 2023-08-02 NOTE — Telephone Encounter (Signed)
Order signed by Kem Boroughs and faxed successfully to [facility].

## 2023-08-03 ENCOUNTER — Encounter: Payer: Self-pay | Admitting: Obstetrics and Gynecology

## 2023-08-07 NOTE — Telephone Encounter (Signed)
Spoke with Madison Combs at [place]. Patient is Scheduled for 08/13/23.

## 2023-08-20 ENCOUNTER — Encounter: Payer: Self-pay | Admitting: Nurse Practitioner

## 2023-09-06 ENCOUNTER — Ambulatory Visit (INDEPENDENT_AMBULATORY_CARE_PROVIDER_SITE_OTHER): Payer: BC Managed Care – PPO | Admitting: Nurse Practitioner

## 2023-09-06 VITALS — BP 112/70 | Temp 98.7°F | Wt 194.0 lb

## 2023-09-06 DIAGNOSIS — R350 Frequency of micturition: Secondary | ICD-10-CM | POA: Diagnosis not present

## 2023-09-06 NOTE — Progress Notes (Signed)
   Acute Office Visit  Subjective:    Patient ID: Madison Combs, female    DOB: Aug 01, 1981, 42 y.o.   MRN: [id number]   HPI 42 y.o. presents today for urinary frequency x 2-3 days. Started cranberry pills and symptoms have improved. Denies urgency, burning with urination, flank pain, hematuria or vaginal symptoms.   Patient's last menstrual period was 08/15/2023.    Review of Systems  Constitutional: Negative.   Genitourinary:  Positive for frequency. Negative for dysuria, flank pain, hematuria, urgency, vaginal discharge and vaginal pain.       Objective:    Physical Exam Constitutional:      Appearance: Normal appearance.   GU: Not indicated  BP 112/70   Temp 98.7 F (37.1 C) (Oral)   Wt 194 lb (88 kg)   LMP 08/15/2023   SpO2 100%   BMI 32.92 kg/m  Wt Readings from Last 3 Encounters:  09/06/23 194 lb (88 kg)  08/02/23 189 lb (85.7 kg)  02/27/23 204 lb 12.8 oz (92.9 kg)        UA: trace leukocytes, neg nitrites, blood neg, dark yellow/cloudy. Microscopic: wbc 6-10, rbc none, moderate bacteria  Assessment & Plan:   Problem List Items Addressed This Visit   None Visit Diagnoses       Frequency of urination    -  Primary   Relevant Orders   Urinalysis,Complete w/RFL Culture      Plan: Very small amount of leukocytosis in urine. Will wait on culture.   Return if symptoms worsen or fail to improve.    Olivia Mackie DNP, 3:40 PM 09/06/2023

## 2023-09-08 LAB — URINALYSIS, COMPLETE W/RFL CULTURE
Bilirubin Urine: NEGATIVE
Glucose, UA: NEGATIVE
Hgb urine dipstick: NEGATIVE
Hyaline Cast: NONE SEEN /[LPF]
Ketones, ur: NEGATIVE
Nitrites, Initial: NEGATIVE
Protein, ur: NEGATIVE
RBC / HPF: NONE SEEN /[HPF] (ref 0–2)
Specific Gravity, Urine: 1.025 (ref 1.001–1.035)
pH: 6 (ref 5.0–8.0)

## 2023-09-08 LAB — URINE CULTURE
MICRO NUMBER:: 15871724
Result:: NO GROWTH
SPECIMEN QUALITY:: ADEQUATE

## 2023-09-08 LAB — CULTURE INDICATED

## 2023-12-20 ENCOUNTER — Other Ambulatory Visit: Payer: Self-pay

## 2023-12-20 DIAGNOSIS — Z3045 Encounter for surveillance of transdermal patch hormonal contraceptive device: Secondary | ICD-10-CM

## 2023-12-20 NOTE — Telephone Encounter (Signed)
 Med refill request: Xulane Last AEX: 02/27/23 Next AEX: 03/04/24 Last MMG (if hormonal med) 08/13/23 Refill authorized: Please Advise, 39, 3 RF

## 2023-12-21 MED ORDER — NORELGESTROMIN-ETH ESTRADIOL 150-35 MCG/24HR TD PTWK: 1.0000 | MEDICATED_PATCH | TRANSDERMAL | 3 refills | Status: DC

## 2024-02-26 LAB — HM MAMMOGRAPHY

## 2024-02-27 ENCOUNTER — Encounter: Payer: Self-pay | Admitting: Nurse Practitioner

## 2024-02-28 ENCOUNTER — Ambulatory Visit: Payer: BC Managed Care – PPO | Admitting: Nurse Practitioner

## 2024-03-03 NOTE — Progress Notes (Deleted)
   Madison Combs [birth year] [id number]   History:  43 y.o. G2P0011 presents for annual exam without GYN complaints. Monthly cycle/Xulane  patches. 2018 normal cytology positive HPV, 2020 ASCUS positive HPV with LEEP CIN-3 with negative margins positive 16, normal paps since. Normal mammogram history.   Gynecologic History No LMP recorded.   Contraception/Family planning: Ortho-Evra patches weekly Sexually active: No  Health Maintenance Last Pap: 02/15/2022. Results were: Normal neg HPV, 3-year repeat Last mammogram: 02/26/2024. Results were: Normal Last colonoscopy: Not indicated Last Dexa: Not indicated  Past medical history, past surgical history, family history and social history were all reviewed and documented in the EPIC chart. Widowed. 67 yo daughter. 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   ROS:  A ROS was performed and pertinent positives and negatives are included.  Exam:  There were no vitals filed for this visit.   There is no height or weight on file to calculate BMI.  General appearance:  Normal Thyroid:  Symmetrical, normal in size, without palpable masses or nodularity. Respiratory  Auscultation:  Clear without wheezing or rhonchi Cardiovascular  Auscultation:  Regular rate, without rubs, murmurs or gallops  Edema/varicosities:  Not grossly evident Abdominal  Soft,nontender, without masses, guarding or rebound.  Liver/spleen:  No organomegaly noted  Hernia:  None appreciated  Skin  Inspection:  Grossly normal Breasts: Examined lying and sitting.   Right: Without masses, retractions, nipple discharge or axillary adenopathy.   Left: Without masses, retractions, nipple discharge or axillary adenopathy. Pelvic: External genitalia:  no lesions              Urethra:  normal appearing urethra with no masses, tenderness or lesions              Bartholins and Skenes: normal                 Vagina: normal appearing vagina with normal color and discharge, no lesions               Cervix: no lesions Bimanual Exam:  Uterus:  no masses or tenderness              Adnexa: no mass, fullness, tenderness              Rectovaginal: Deferred              Anus:  normal, no lesions  Madison Combs, CMA present as chaperone.   Assessment/Plan:  43 y.o. G2P0011 for annual exam.   Well female exam with routine gynecological exam - Plan: CBC with Differential/Platelet, Comprehensive metabolic panel. Education provided on SBEs, importance of preventative screenings, current guidelines, high calcium diet, regular exercise, and multivitamin daily. Will return for fasting labs.   High grade squamous intraepithelial lesion (HGSIL), grade 3 CIN, on biopsy of cervix. 2020 LEEP CIN 3 with negative margins +16, normal annual paps x 3. Will repeat at 3-year interval per guidelines.   Hyperlipidemia, unspecified hyperlipidemia type - Plan: Lipid panel  Encounter for surveillance of transdermal patch hormonal contraceptive device - Plan: norelgestromin -ethinyl estradiol  (XULANE ) 150-35 MCG/24HR transdermal patch weekly. Doing well on this and wants to continue. Refill x 1 year provided.  Screening for breast cancer - Normal mammogram history. Continue annual screenings. Normal breast exam today.  No follow-ups on file.   Madison Bamberger DNP, 2:26 PM 03/03/2024

## 2024-03-04 ENCOUNTER — Ambulatory Visit: Payer: BC Managed Care – PPO | Admitting: Nurse Practitioner

## 2024-03-04 DIAGNOSIS — Z1331 Encounter for screening for depression: Secondary | ICD-10-CM

## 2024-03-04 DIAGNOSIS — Z01419 Encounter for gynecological examination (general) (routine) without abnormal findings: Secondary | ICD-10-CM

## 2024-03-04 DIAGNOSIS — E785 Hyperlipidemia, unspecified: Secondary | ICD-10-CM

## 2024-03-04 DIAGNOSIS — Z3045 Encounter for surveillance of transdermal patch hormonal contraceptive device: Secondary | ICD-10-CM

## 2024-03-25 ENCOUNTER — Encounter: Payer: Self-pay | Admitting: Nurse Practitioner

## 2024-03-25 ENCOUNTER — Ambulatory Visit (INDEPENDENT_AMBULATORY_CARE_PROVIDER_SITE_OTHER): Payer: Self-pay | Admitting: Nurse Practitioner

## 2024-03-25 VITALS — BP 124/78 | HR 97 | Ht 65.25 in | Wt 196.0 lb

## 2024-03-25 DIAGNOSIS — Z01419 Encounter for gynecological examination (general) (routine) without abnormal findings: Secondary | ICD-10-CM | POA: Diagnosis not present

## 2024-03-25 DIAGNOSIS — G43831 Menstrual migraine, intractable, with status migrainosus: Secondary | ICD-10-CM | POA: Diagnosis not present

## 2024-03-25 DIAGNOSIS — Z1331 Encounter for screening for depression: Secondary | ICD-10-CM

## 2024-03-25 DIAGNOSIS — Z3045 Encounter for surveillance of transdermal patch hormonal contraceptive device: Secondary | ICD-10-CM

## 2024-03-25 DIAGNOSIS — E785 Hyperlipidemia, unspecified: Secondary | ICD-10-CM

## 2024-03-25 DIAGNOSIS — Z3009 Encounter for other general counseling and advice on contraception: Secondary | ICD-10-CM

## 2024-03-25 MED ORDER — NORELGESTROMIN-ETH ESTRADIOL 150-35 MCG/24HR TD PTWK: 1.0000 | MEDICATED_PATCH | TRANSDERMAL | 0 refills | Status: AC

## 2024-03-25 NOTE — Progress Notes (Signed)
 Madison Combs 1981/05/07 [id number]   History:  43 y.o. G2P0011 presents for annual exam. Monthly cycle/Xulane  patches. Cycle vary in flow from light to heavy. Reports headaches that start the week before menses each month for about a year. Headaches get better once menses start. 2018 normal cytology positive HPV, 2020 ASCUS positive HPV with LEEP CIN-3 with negative margins positive 16, normal paps since. Normal mammogram history. Considering BTL.   Gynecologic History Patient's last menstrual period was 03/05/2024 (approximate). Period Duration (Days): 4-5 Period Pattern: Regular Menstrual Flow: Moderate Menstrual Control: Maxi pad Dysmenorrhea: (!) Mild Dysmenorrhea Symptoms: Headache (no cramping) Contraception/Family planning: Ortho-Evra patches weekly Sexually active: No  Health Maintenance Last Pap: 02/15/2022. Results were: Normal neg HPV, 3-year repeat Last mammogram: 02/26/2024. Results were: Normal Last colonoscopy: Not indicated Last Dexa: Not indicated  Past medical history, past surgical history, family history and social history were all reviewed and documented in the EPIC chart. Widowed. 33 yo daughter. 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   ROS:  A ROS was performed and pertinent positives and negatives are included.  Exam:  Vitals:   03/25/24 1356  BP: 124/78  Pulse: 97  SpO2: 98%  Weight: 196 lb (88.9 kg)  Height: 5' 5.25 (1.657 m)     Body mass index is 32.37 kg/m.  General appearance:  Normal Thyroid:  Symmetrical, normal in size, without palpable masses or nodularity. Respiratory  Auscultation:  Clear without wheezing or rhonchi Cardiovascular  Auscultation:  Regular rate, without rubs, murmurs or gallops  Edema/varicosities:  Not grossly evident Abdominal  Soft,nontender, without masses, guarding or rebound.  Liver/spleen:  No organomegaly noted  Hernia:  None appreciated  Skin  Inspection:  Grossly normal Breasts: Examined lying and  sitting.   Right: Without masses, retractions, nipple discharge or axillary adenopathy.   Left: Without masses, retractions, nipple discharge or axillary adenopathy. Pelvic: External genitalia:  no lesions              Urethra:  normal appearing urethra with no masses, tenderness or lesions              Bartholins and Skenes: normal                 Vagina: normal appearing vagina with normal color and discharge, no lesions              Cervix: no lesions Bimanual Exam:  Uterus:  no masses or tenderness              Adnexa: no mass, fullness, tenderness              Rectovaginal: Deferred              Anus:  normal, no lesions  Patient informed chaperone available to be present for breast and pelvic exam. Patient has requested no chaperone to be present. Patient has been advised what will be completed during breast and pelvic exam.   Assessment/Plan:  43 y.o. G2P0011 for annual exam.   Well female exam with routine gynecological exam - Plan: CBC with Differential/Platelet, Comprehensive metabolic panel. Education provided on SBEs, importance of preventative screenings, current guidelines, high calcium diet, regular exercise, and multivitamin daily. Will return for fasting labs.   Hyperlipidemia, unspecified hyperlipidemia type - Plan: Lipid panel  Encounter for surveillance of transdermal patch hormonal contraceptive device - Plan: norelgestromin -ethinyl estradiol  (XULANE ) 150-35 MCG/24HR transdermal patch weekly. Consider switching to progestin-only d/t worsening headaches. Normal BP.   Menstrual migraine, with intractable migraine,  so stated, with status migrainosus - Reports headaches that start the week before menses each month for about a year. Headaches get better once menses start. Begin mag oxide. If no improvement in headaches recommend switching to progestin-only. Could be perimenopausal related.   General counseling and advice on female contraception - Interested in BTL. Will  schedule consult.   Screening for cervical cancer - 2020 LEEP CIN 3 with negative margins +16, normal annual paps x 3. Will repeat at 3-year interval per guidelines.   Screening for breast cancer - Normal mammogram history.  Continue annual screenings.  Normal breast exam today.  Return in about 1 year (around 03/25/2025) for Annual.   Madison DELENA Shutter DNP, 2:42 PM 03/25/2024

## 2024-03-31 ENCOUNTER — Other Ambulatory Visit

## 2024-03-31 DIAGNOSIS — Z01419 Encounter for gynecological examination (general) (routine) without abnormal findings: Secondary | ICD-10-CM

## 2024-03-31 DIAGNOSIS — E785 Hyperlipidemia, unspecified: Secondary | ICD-10-CM

## 2024-03-31 LAB — COMPREHENSIVE METABOLIC PANEL WITH GFR
AG Ratio: 1.4 (calc) (ref 1.0–2.5)
ALT: 7 U/L (ref 6–29)
AST: 12 U/L (ref 10–30)
Albumin: 3.9 g/dL (ref 3.6–5.1)
Alkaline phosphatase (APISO): 105 U/L (ref 31–125)
BUN: 12 mg/dL (ref 7–25)
CO2: 26 mmol/L (ref 20–32)
Calcium: 9.1 mg/dL (ref 8.6–10.2)
Chloride: 101 mmol/L (ref 98–110)
Creat: 0.78 mg/dL (ref 0.50–0.99)
Globulin: 2.8 g/dL (ref 1.9–3.7)
Glucose, Bld: 85 mg/dL (ref 65–99)
Potassium: 3.8 mmol/L (ref 3.5–5.3)
Sodium: 137 mmol/L (ref 135–146)
Total Bilirubin: 0.4 mg/dL (ref 0.2–1.2)
Total Protein: 6.7 g/dL (ref 6.1–8.1)
eGFR: 97 mL/min/1.73m2 (ref >=60)

## 2024-03-31 LAB — CBC WITH DIFFERENTIAL/PLATELET
Absolute Lymphocytes: 3288 {cells}/uL (ref 850–3900)
Absolute Monocytes: 528 {cells}/uL (ref 200–950)
Basophils Absolute: 32 {cells}/uL (ref 0–200)
Basophils Relative: 0.4 %
Eosinophils Absolute: 120 {cells}/uL (ref 15–500)
Eosinophils Relative: 1.5 %
HCT: 40.6 % (ref 35.0–45.0)
Hemoglobin: 13.1 g/dL (ref 11.7–15.5)
MCH: 29.6 pg (ref 27.0–33.0)
MCHC: 32.3 g/dL (ref 32.0–36.0)
MCV: 91.9 fL (ref 80.0–100.0)
MPV: 9.8 fL (ref 7.5–12.5)
Monocytes Relative: 6.6 %
Neutro Abs: 4032 {cells}/uL (ref 1500–7800)
Neutrophils Relative %: 50.4 %
Platelets: 410 Thousand/uL — ABNORMAL HIGH (ref 140–400)
RBC: 4.42 Million/uL (ref 3.80–5.10)
RDW: 13.1 % (ref 11.0–15.0)
Total Lymphocyte: 41.1 %
WBC: 8 Thousand/uL (ref 3.8–10.8)

## 2024-03-31 LAB — LIPID PANEL
Cholesterol: 255 mg/dL — ABNORMAL HIGH (ref <200)
HDL: 71 mg/dL (ref >=50)
LDL Cholesterol (Calc): 159 mg/dL — ABNORMAL HIGH
Non-HDL Cholesterol (Calc): 184 mg/dL — ABNORMAL HIGH (ref <130)
Total CHOL/HDL Ratio: 3.6 (calc) (ref <5.0)
Triglycerides: 127 mg/dL (ref <150)

## 2024-04-01 ENCOUNTER — Ambulatory Visit: Payer: Self-pay | Admitting: Nurse Practitioner

## 2024-04-16 ENCOUNTER — Ambulatory Visit: Admitting: Obstetrics and Gynecology
